# Patient Record
Sex: Female | Born: 1970 | Race: White | Hispanic: No | Marital: Married | Smoking: Never smoker
Health system: Southern US, Community
[De-identification: ages and names within clinical notes are randomized; demographics above are authoritative.]

## PROBLEM LIST (undated history)

## (undated) DIAGNOSIS — M51369 Other intervertebral disc degeneration, lumbar region without mention of lumbar back pain or lower extremity pain: Secondary | ICD-10-CM

## (undated) DIAGNOSIS — G4730 Sleep apnea, unspecified: Secondary | ICD-10-CM

## (undated) DIAGNOSIS — E559 Vitamin D deficiency, unspecified: Secondary | ICD-10-CM

## (undated) DIAGNOSIS — M5136 Other intervertebral disc degeneration, lumbar region: Secondary | ICD-10-CM

## (undated) DIAGNOSIS — R112 Nausea with vomiting, unspecified: Secondary | ICD-10-CM

## (undated) DIAGNOSIS — R200 Anesthesia of skin: Secondary | ICD-10-CM

## (undated) DIAGNOSIS — R748 Abnormal levels of other serum enzymes: Secondary | ICD-10-CM

## (undated) DIAGNOSIS — K760 Fatty (change of) liver, not elsewhere classified: Secondary | ICD-10-CM

## (undated) DIAGNOSIS — E039 Hypothyroidism, unspecified: Secondary | ICD-10-CM

## (undated) DIAGNOSIS — E8881 Metabolic syndrome: Secondary | ICD-10-CM

## (undated) DIAGNOSIS — Z9889 Other specified postprocedural states: Secondary | ICD-10-CM

## (undated) DIAGNOSIS — T4145XA Adverse effect of unspecified anesthetic, initial encounter: Secondary | ICD-10-CM

## (undated) DIAGNOSIS — T8859XA Other complications of anesthesia, initial encounter: Secondary | ICD-10-CM

## (undated) DIAGNOSIS — D649 Anemia, unspecified: Secondary | ICD-10-CM

## (undated) DIAGNOSIS — M1990 Unspecified osteoarthritis, unspecified site: Secondary | ICD-10-CM

## (undated) DIAGNOSIS — R51 Headache: Secondary | ICD-10-CM

## (undated) HISTORY — PX: DILATION AND CURETTAGE OF UTERUS: SHX78

## (undated) HISTORY — PX: CHOLECYSTECTOMY: SHX55

## (undated) HISTORY — PX: ABDOMINAL HYSTERECTOMY: SHX81

---

## 1999-05-12 ENCOUNTER — Other Ambulatory Visit: Admission: RE | Admit: 1999-05-12 | Discharge: 1999-05-12 | Payer: Self-pay | Admitting: Obstetrics and Gynecology

## 1999-12-30 ENCOUNTER — Ambulatory Visit (HOSPITAL_COMMUNITY): Admission: RE | Admit: 1999-12-30 | Discharge: 1999-12-30 | Payer: Self-pay | Admitting: Obstetrics and Gynecology

## 2001-01-12 ENCOUNTER — Other Ambulatory Visit: Admission: RE | Admit: 2001-01-12 | Discharge: 2001-01-12 | Payer: Self-pay | Admitting: Obstetrics and Gynecology

## 2002-01-31 ENCOUNTER — Observation Stay (HOSPITAL_COMMUNITY): Admission: RE | Admit: 2002-01-31 | Discharge: 2002-02-01 | Payer: Self-pay | Admitting: Obstetrics and Gynecology

## 2005-05-07 ENCOUNTER — Ambulatory Visit: Payer: Self-pay | Admitting: Internal Medicine

## 2011-01-25 ENCOUNTER — Ambulatory Visit: Payer: Self-pay | Admitting: Internal Medicine

## 2011-09-12 ENCOUNTER — Emergency Department: Payer: Self-pay | Admitting: Internal Medicine

## 2012-02-13 ENCOUNTER — Emergency Department: Payer: Self-pay | Admitting: Emergency Medicine

## 2012-03-02 ENCOUNTER — Ambulatory Visit: Payer: Self-pay | Admitting: Internal Medicine

## 2012-03-20 ENCOUNTER — Ambulatory Visit: Payer: Self-pay | Admitting: Internal Medicine

## 2013-02-08 IMAGING — MG MAM DGTL SCRN MAM NO ORDER W/CAD
1 series · 4 of 4 positions shown · non-contrast
Comparison: none

REASON FOR EXAM: scr mammo no order
COMMENTS:

PROCEDURE:     MAM - MAM DGTL SCRN MAM NO ORDER W/CAD  - March 20, 2012  [DATE]
RESULT:     Comparison is made to the prior exam of January 25, 2011. The
breast parenchyma bilaterally is predominantly fatty. No dominant mass or
malignant appearing microcalcifications are identified.

[Series 8416: R CC · right · 4 of 4 slices shown]
[im 1/4]
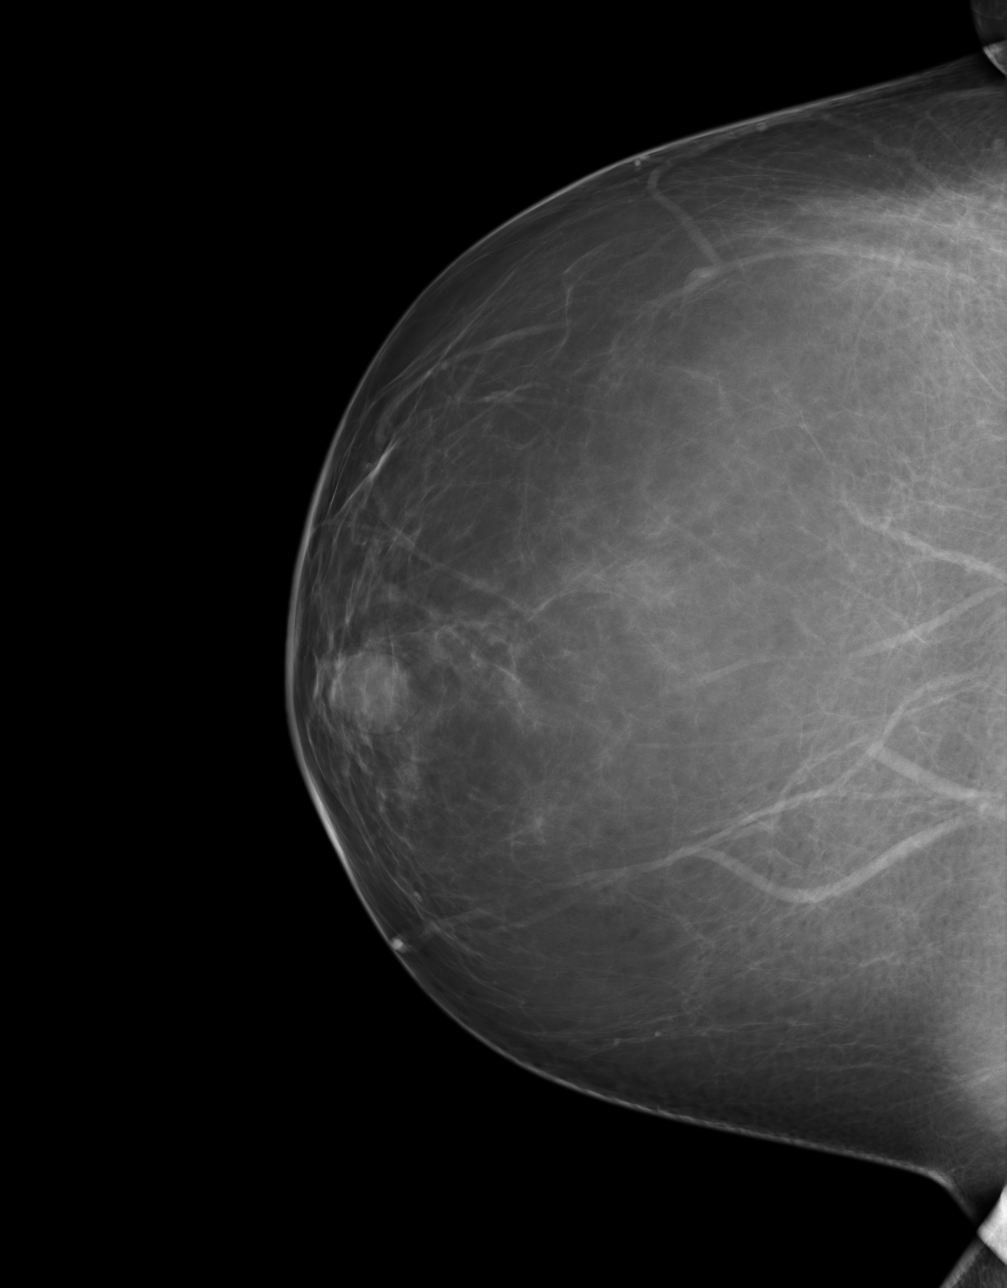
[im 2/4]
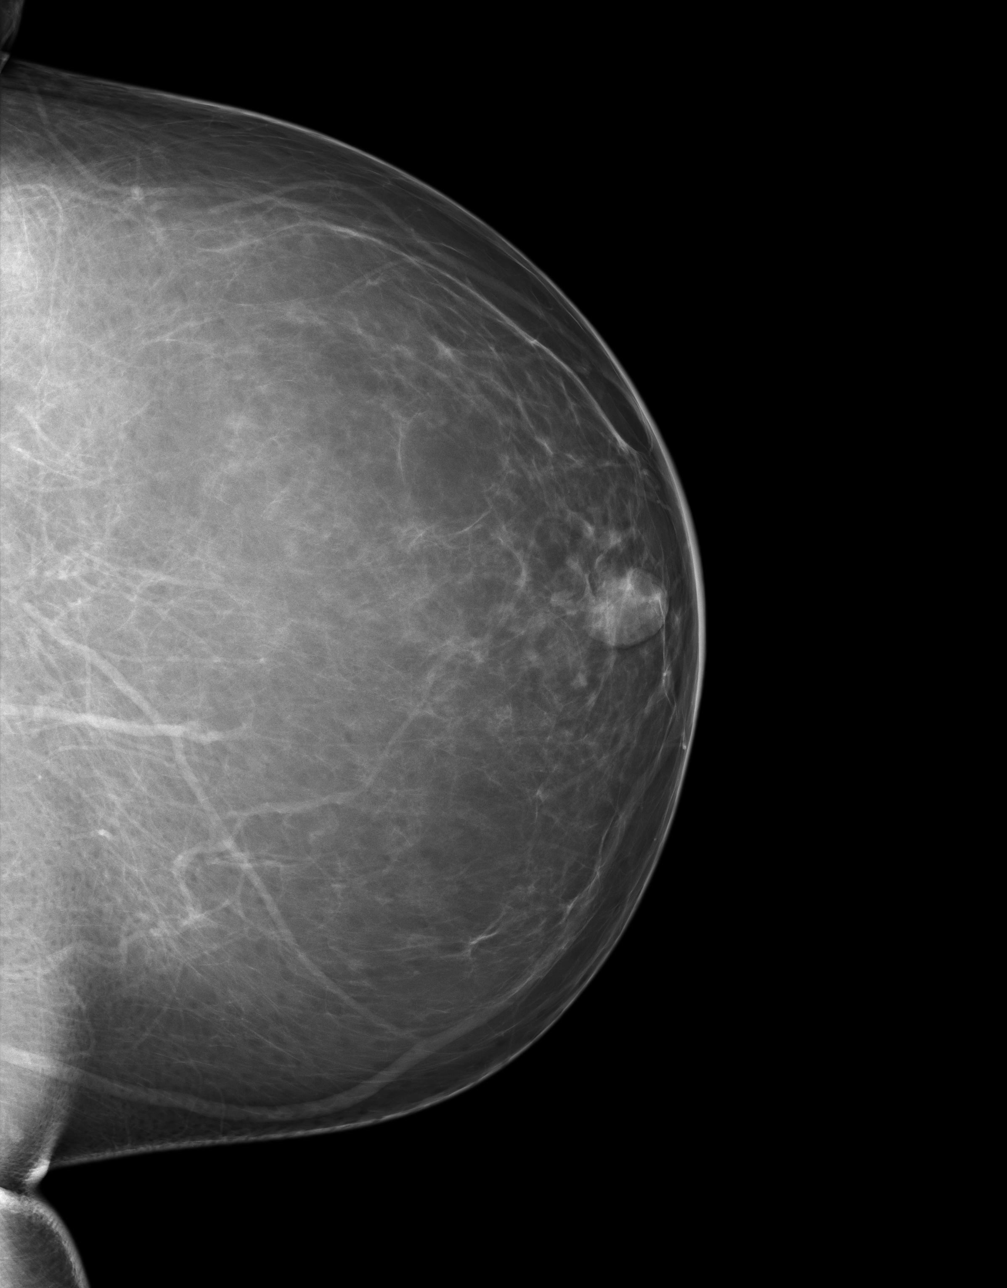
[im 3/4]
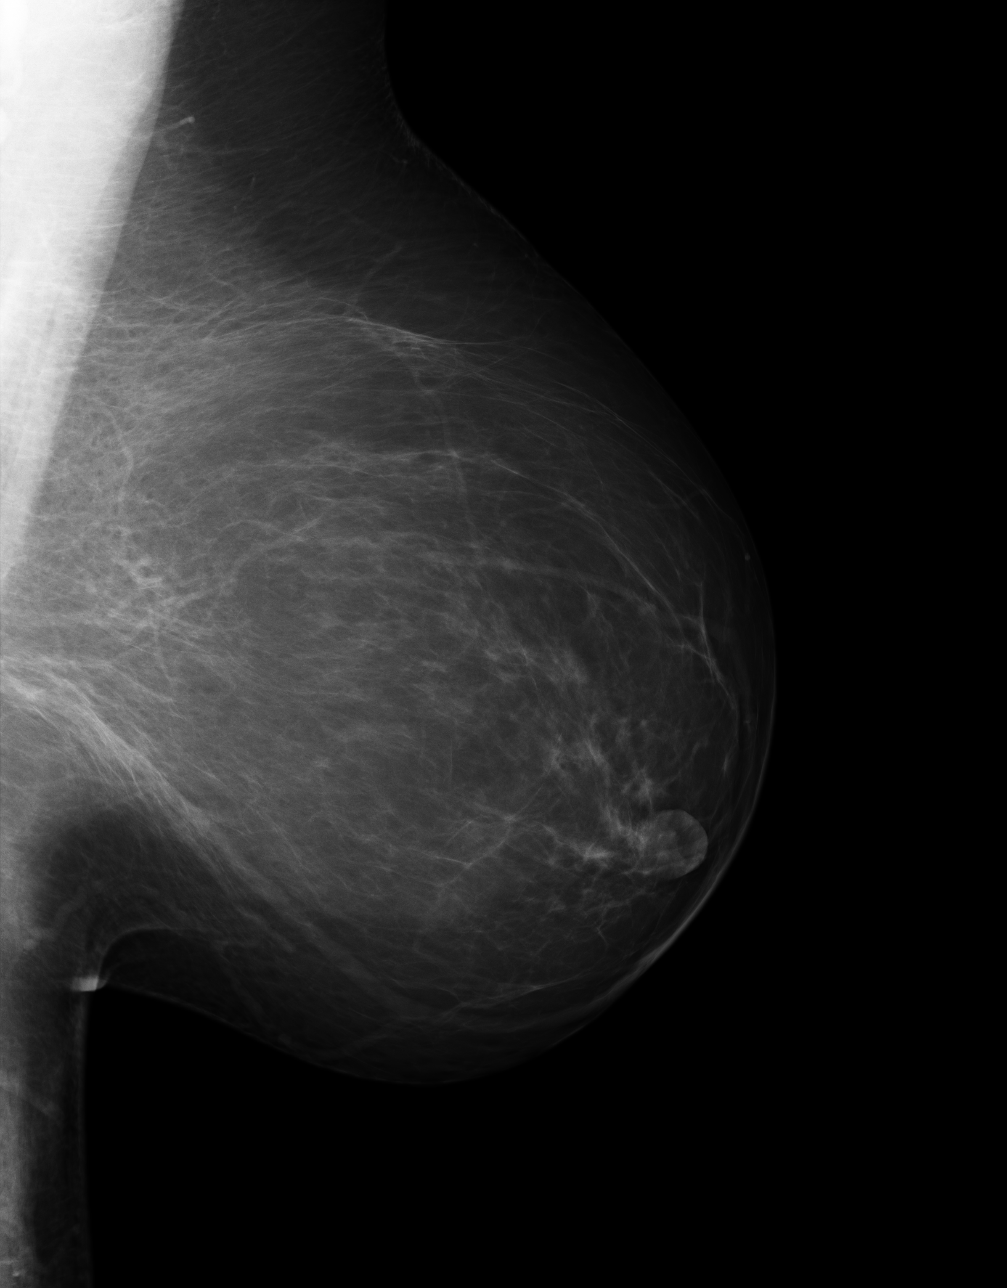
[im 4/4]
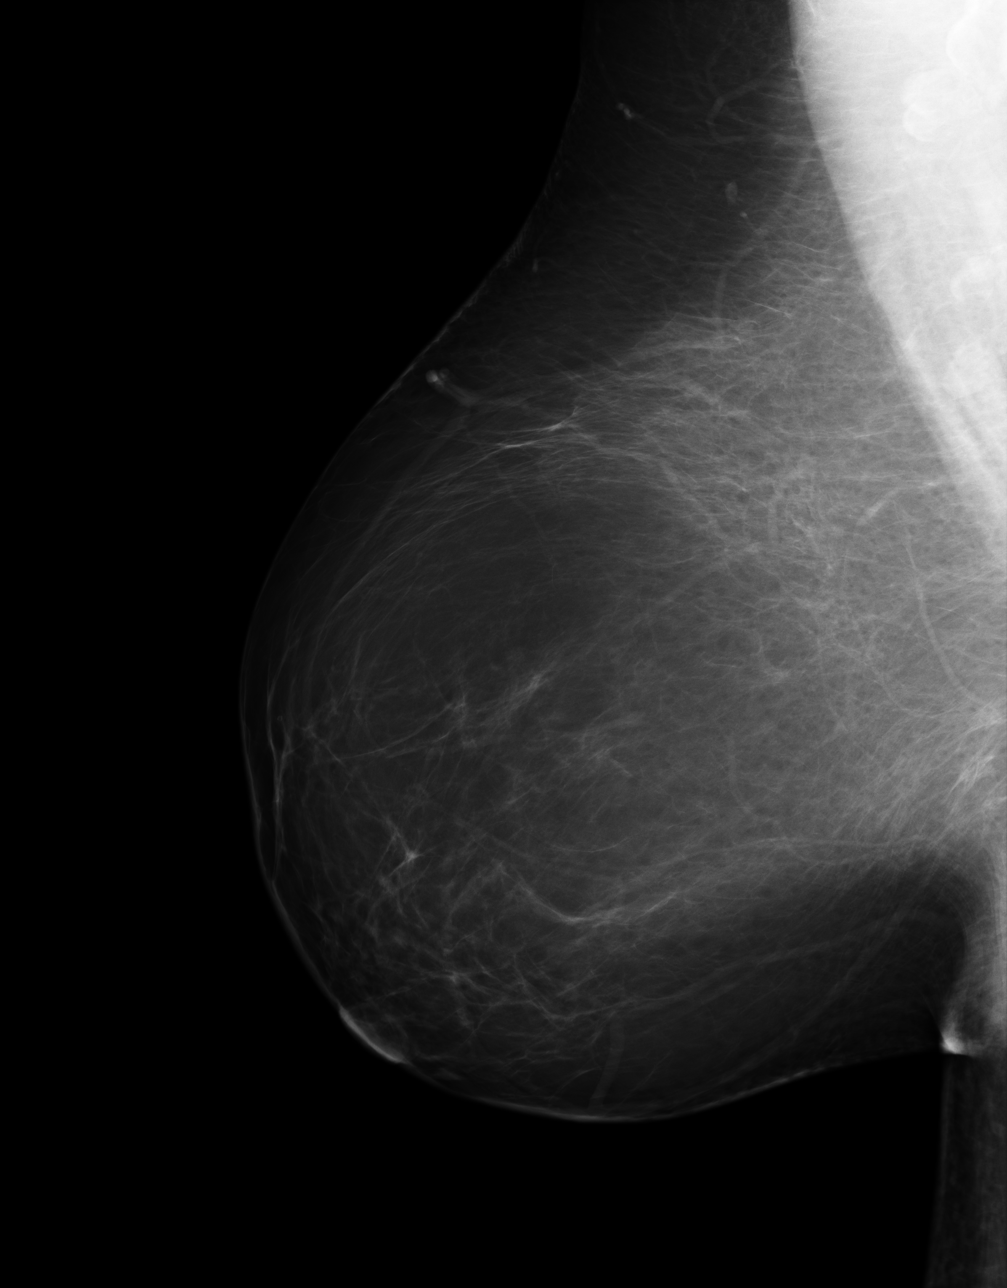

[4 of 4 positions shown; findings below may reference images not displayed]

IMPRESSION: 1. Stable, bilaterally benign-appearing screening mammography.
2. Continued annual screening mammography is recommended.

BI-RADS: Category 1 - Negative

A NEGATIVE MAMMOGRAM REPORT DOES NOT PRECLUDE BIOPSY OR OTHER EVALUATION OF
A CLINICALLY PALPABLE OR OTHERWISE SUSPICIOUS MASS OR LESION. BREAST CANCER
MAY NOT BE DETECTED BY MAMMOGRAPHY IN UP TO 10% OF CASES.

[REDACTED]

## 2013-05-28 ENCOUNTER — Ambulatory Visit: Payer: Self-pay | Admitting: Internal Medicine

## 2013-05-30 ENCOUNTER — Ambulatory Visit: Payer: Self-pay | Admitting: Internal Medicine

## 2013-11-15 ENCOUNTER — Other Ambulatory Visit: Payer: Self-pay | Admitting: Neurosurgery

## 2013-11-15 DIAGNOSIS — M5126 Other intervertebral disc displacement, lumbar region: Secondary | ICD-10-CM

## 2013-11-22 ENCOUNTER — Ambulatory Visit
Admission: RE | Admit: 2013-11-22 | Discharge: 2013-11-22 | Disposition: A | Discharge status: Home / Self Care | Payer: 59 | Source: Ambulatory Visit | Attending: Neurosurgery | Admitting: Neurosurgery

## 2013-11-22 DIAGNOSIS — M5126 Other intervertebral disc displacement, lumbar region: Secondary | ICD-10-CM

## 2013-11-30 ENCOUNTER — Other Ambulatory Visit: Payer: Self-pay | Admitting: Neurosurgery

## 2013-11-30 ENCOUNTER — Encounter (HOSPITAL_COMMUNITY): Payer: Self-pay

## 2013-11-30 ENCOUNTER — Encounter (HOSPITAL_COMMUNITY)
Admission: RE | Admit: 2013-11-30 | Discharge: 2013-11-30 | Disposition: A | Discharge status: Home / Self Care | Payer: 59 | Source: Ambulatory Visit | Attending: Neurosurgery | Admitting: Neurosurgery

## 2013-11-30 DIAGNOSIS — Z01812 Encounter for preprocedural laboratory examination: Secondary | ICD-10-CM | POA: Insufficient documentation

## 2013-11-30 HISTORY — DX: Unspecified osteoarthritis, unspecified site: M19.90

## 2013-11-30 HISTORY — DX: Other complications of anesthesia, initial encounter: T88.59XA

## 2013-11-30 HISTORY — DX: Adverse effect of unspecified anesthetic, initial encounter: T41.45XA

## 2013-11-30 HISTORY — DX: Nausea with vomiting, unspecified: R11.2

## 2013-11-30 HISTORY — DX: Other specified postprocedural states: Z98.890

## 2013-11-30 HISTORY — DX: Anesthesia of skin: R20.0

## 2013-11-30 HISTORY — DX: Hypothyroidism, unspecified: E03.9

## 2013-11-30 HISTORY — DX: Sleep apnea, unspecified: G47.30

## 2013-11-30 HISTORY — DX: Headache: R51

## 2013-11-30 HISTORY — DX: Anemia, unspecified: D64.9

## 2013-11-30 LAB — CBC
HCT: 39.5 % (ref 36.0–46.0)
Hemoglobin: 13.3 g/dL (ref 12.0–15.0)
MCH: 31 pg (ref 26.0–34.0)
MCHC: 33.7 g/dL (ref 30.0–36.0)
MCV: 92.1 fL (ref 78.0–100.0)
PLATELETS: 228 10*3/uL (ref 150–400)
RBC: 4.29 MIL/uL (ref 3.87–5.11)
RDW: 13.4 % (ref 11.5–15.5)
WBC: 8.6 10*3/uL (ref 4.0–10.5)

## 2013-11-30 LAB — BASIC METABOLIC PANEL
BUN: 13 mg/dL (ref 6–23)
CALCIUM: 9.6 mg/dL (ref 8.4–10.5)
CO2: 26 mEq/L (ref 19–32)
CREATININE: 0.69 mg/dL (ref 0.50–1.10)
Chloride: 100 mEq/L (ref 96–112)
GFR calc Af Amer: >90 mL/min (ref >90)
GLUCOSE: 92 mg/dL (ref 70–99)
Potassium: 4.6 mEq/L (ref 3.7–5.3)
Sodium: 137 mEq/L (ref 137–147)

## 2013-11-30 LAB — SURGICAL PCR SCREEN
MRSA, PCR: NEGATIVE
Staphylococcus aureus: NEGATIVE

## 2013-11-30 NOTE — Pre-Procedure Instructions (Addendum)
Jackie Reynolds  11/30/2013   Your procedure is scheduled on:  Wednesday, December 05, 2013 at 8:30 AM  Report to [facility] Short Stay (use Main Entrance "A'') at 6:30 AM.  Call this number if you have problems the morning of surgery: [phone]   Remember:   Do not eat food or drink liquids after midnight.   Take these medicines the morning of surgery with A SIP OF WATER: levothyroxine (SYNTHROID, LEVOTHROID, if needed: hydrocodone for pain,  zicam for congestion ,   Stop taking Aspirin and herbal medications. Do not take any NSAIDs ie: Ibuprofen, Advil, Naproxen or any medication containing Aspirin.  Do not wear jewelry, make-up or nail polish.  Do not wear lotions, powders, or perfumes. You may wear deodorant.  Do not shave 48 hours prior to surgery.   Do not bring valuables to the hospital.  [facility] is not responsible for any belongings or valuables.               Contacts, dentures or bridgework may not be worn into surgery.  Leave suitcase in the car. After surgery it may be brought to your room.  For patients admitted to the hospital, discharge time is determined by your treatment team.               Patients discharged the day of surgery will not be allowed to drive home.  Name and phone number of your driver:   Special Instructions:  Special Instructions:Special Instructions: [facility] - Preparing for Surgery  Before surgery, you can play an important role.  Because skin is not sterile, your skin needs to be as free of germs as possible.  You can reduce the number of germs on you skin by washing with CHG (chlorahexidine gluconate) soap before surgery.  CHG is an antiseptic cleaner which kills germs and bonds with the skin to continue killing germs even after washing.  Please DO NOT use if you have an allergy to CHG or antibacterial soaps.  If your skin becomes reddened/irritated stop using the CHG and inform your nurse when you arrive at Short Stay.  Do not shave  (including legs and underarms) for at least 48 hours prior to the first CHG shower.  You may shave your face.  Please follow these instructions carefully:   1.  Shower with CHG Soap the night before surgery and the morning of Surgery.  2.  If you choose to wash your hair, wash your hair first as usual with your normal shampoo.  3.  After you shampoo, rinse your hair and body thoroughly to remove the Shampoo.  4.  Use CHG as you would any other liquid soap.  You can apply chg directly  to the skin and wash gently with scrungie or a clean washcloth.  5.  Apply the CHG Soap to your body ONLY FROM THE NECK DOWN.  Do not use on open wounds or open sores.  Avoid contact with your eyes, ears, mouth and genitals (private parts).  Wash genitals (private parts) with your normal soap.  6.  Wash thoroughly, paying special attention to the area where your surgery will be performed.  7.  Thoroughly rinse your body with warm water from the neck down.  8.  DO NOT shower/wash with your normal soap after using and rinsing off the CHG Soap.  9.  Pat yourself dry with a clean towel.            10.  Wear  clean pajamas.            11.  Place clean sheets on your bed the night of your first shower and do not sleep with pets.  Day of Surgery  Do not apply any lotions the morning of surgery.  Please wear clean clothes to the hospital/surgery center.   Please read over the following fact sheets that you were given: Pain Booklet, Coughing and Deep Breathing, MRSA Information and Surgical Site Infection Prevention

## 2013-12-04 MED ORDER — CEFAZOLIN SODIUM 10 G IJ SOLR
3.0000 g | INTRAMUSCULAR | Status: AC
  Administered 2013-12-05: 3 g via INTRAVENOUS
  Filled 2013-12-04: qty 3000

## 2013-12-05 ENCOUNTER — Ambulatory Visit (HOSPITAL_COMMUNITY): Payer: 59 | Admitting: Anesthesiology

## 2013-12-05 ENCOUNTER — Encounter (HOSPITAL_COMMUNITY): Payer: 59 | Admitting: Anesthesiology

## 2013-12-05 ENCOUNTER — Encounter (HOSPITAL_COMMUNITY): Payer: Self-pay | Admitting: *Deleted

## 2013-12-05 ENCOUNTER — Encounter (HOSPITAL_COMMUNITY)
Admission: RE | Disposition: A | Discharge status: Home / Self Care | Payer: Self-pay | Source: Ambulatory Visit | Attending: Neurosurgery

## 2013-12-05 ENCOUNTER — Ambulatory Visit (HOSPITAL_COMMUNITY): Payer: 59

## 2013-12-05 ENCOUNTER — Ambulatory Visit (HOSPITAL_COMMUNITY)
Admission: RE | Admit: 2013-12-05 | Discharge: 2013-12-05 | Disposition: A | Discharge status: Home / Self Care | Payer: 59 | Source: Ambulatory Visit | Attending: Neurosurgery | Admitting: Neurosurgery

## 2013-12-05 DIAGNOSIS — M129 Arthropathy, unspecified: Secondary | ICD-10-CM | POA: Insufficient documentation

## 2013-12-05 DIAGNOSIS — M5126 Other intervertebral disc displacement, lumbar region: Secondary | ICD-10-CM | POA: Diagnosis present

## 2013-12-05 DIAGNOSIS — G4730 Sleep apnea, unspecified: Secondary | ICD-10-CM | POA: Insufficient documentation

## 2013-12-05 DIAGNOSIS — D649 Anemia, unspecified: Secondary | ICD-10-CM | POA: Insufficient documentation

## 2013-12-05 DIAGNOSIS — E039 Hypothyroidism, unspecified: Secondary | ICD-10-CM | POA: Insufficient documentation

## 2013-12-05 DIAGNOSIS — R51 Headache: Secondary | ICD-10-CM | POA: Insufficient documentation

## 2013-12-05 DIAGNOSIS — R209 Unspecified disturbances of skin sensation: Secondary | ICD-10-CM | POA: Insufficient documentation

## 2013-12-05 HISTORY — PX: LUMBAR LAMINECTOMY/DECOMPRESSION MICRODISCECTOMY: SHX5026

## 2013-12-05 SURGERY — LUMBAR LAMINECTOMY/DECOMPRESSION MICRODISCECTOMY 1 LEVEL
Anesthesia: General | Site: Back | Laterality: Right

## 2013-12-05 MED ORDER — DEXAMETHASONE SODIUM PHOSPHATE 4 MG/ML IJ SOLN
INTRAMUSCULAR | Status: AC
  Filled 2013-12-05: qty 2

## 2013-12-05 MED ORDER — PROMETHAZINE HCL 25 MG/ML IJ SOLN
INTRAMUSCULAR | Status: AC
  Filled 2013-12-05: qty 1

## 2013-12-05 MED ORDER — LACTATED RINGERS IV SOLN
INTRAVENOUS | Status: DC | PRN
  Administered 2013-12-05 (×2): via INTRAVENOUS

## 2013-12-05 MED ORDER — NEOSTIGMINE METHYLSULFATE 1 MG/ML IJ SOLN
INTRAMUSCULAR | Status: DC | PRN
  Administered 2013-12-05: 3 mg via INTRAVENOUS

## 2013-12-05 MED ORDER — MIDAZOLAM HCL 2 MG/2ML IJ SOLN
INTRAMUSCULAR | Status: AC
  Filled 2013-12-05: qty 2

## 2013-12-05 MED ORDER — OXYCODONE HCL 5 MG/5ML PO SOLN: 5.0000 mg | Freq: Once | ORAL | Status: DC | PRN

## 2013-12-05 MED ORDER — LIDOCAINE HCL (CARDIAC) 20 MG/ML IV SOLN
INTRAVENOUS | Status: DC | PRN
  Administered 2013-12-05: 100 mg via INTRAVENOUS

## 2013-12-05 MED ORDER — LACTATED RINGERS IV SOLN: INTRAVENOUS | Status: DC

## 2013-12-05 MED ORDER — HEMOSTATIC AGENTS (NO CHARGE) OPTIME
TOPICAL | Status: DC | PRN
  Administered 2013-12-05: 1 via TOPICAL

## 2013-12-05 MED ORDER — NEOSTIGMINE METHYLSULFATE 1 MG/ML IJ SOLN
INTRAMUSCULAR | Status: AC
  Filled 2013-12-05: qty 10

## 2013-12-05 MED ORDER — MENTHOL 3 MG MT LOZG: 1.0000 | LOZENGE | OROMUCOSAL | Status: DC | PRN

## 2013-12-05 MED ORDER — GLYCOPYRROLATE 0.2 MG/ML IJ SOLN
INTRAMUSCULAR | Status: DC | PRN
  Administered 2013-12-05: 0.4 mg via INTRAVENOUS

## 2013-12-05 MED ORDER — DEXAMETHASONE SODIUM PHOSPHATE 4 MG/ML IJ SOLN
INTRAMUSCULAR | Status: DC | PRN
Start: 2013-12-05 — End: 2013-12-05
  Administered 2013-12-05: 8 mg via INTRAVENOUS

## 2013-12-05 MED ORDER — LEVOTHYROXINE SODIUM 175 MCG PO TABS
175.0000 ug | ORAL_TABLET | Freq: Every day | ORAL | Status: DC
  Filled 2013-12-05: qty 1

## 2013-12-05 MED ORDER — METOCLOPRAMIDE HCL 5 MG/ML IJ SOLN: 10.0000 mg | Freq: Once | INTRAMUSCULAR | Status: DC | PRN

## 2013-12-05 MED ORDER — DIAZEPAM 5 MG PO TABS: 5.0000 mg | ORAL_TABLET | Freq: Four times a day (QID) | ORAL | Status: DC | PRN

## 2013-12-05 MED ORDER — GLYCOPYRROLATE 0.2 MG/ML IJ SOLN
INTRAMUSCULAR | Status: AC
  Filled 2013-12-05: qty 2

## 2013-12-05 MED ORDER — ROCURONIUM BROMIDE 100 MG/10ML IV SOLN
INTRAVENOUS | Status: DC | PRN
  Administered 2013-12-05: 50 mg via INTRAVENOUS

## 2013-12-05 MED ORDER — HYDROCODONE-ACETAMINOPHEN 5-325 MG PO TABS
1.0000 | ORAL_TABLET | ORAL | Status: DC | PRN
Start: 2013-12-05 — End: 2013-12-05

## 2013-12-05 MED ORDER — DEXTROSE 5 % IV SOLN
3.0000 g | Freq: Three times a day (TID) | INTRAVENOUS | Status: DC
  Filled 2013-12-05 (×2): qty 3000

## 2013-12-05 MED ORDER — ONDANSETRON HCL 4 MG/2ML IJ SOLN
4.0000 mg | INTRAMUSCULAR | Status: DC | PRN
Start: 2013-12-05 — End: 2013-12-05

## 2013-12-05 MED ORDER — MORPHINE SULFATE 2 MG/ML IJ SOLN: 1.0000 mg | INTRAMUSCULAR | Status: DC | PRN

## 2013-12-05 MED ORDER — MIDAZOLAM HCL 5 MG/5ML IJ SOLN
INTRAMUSCULAR | Status: DC | PRN
  Administered 2013-12-05: 2 mg via INTRAVENOUS

## 2013-12-05 MED ORDER — ACETAMINOPHEN 650 MG RE SUPP: 650.0000 mg | RECTAL | Status: DC | PRN

## 2013-12-05 MED ORDER — OXYCODONE-ACETAMINOPHEN 10-325 MG PO TABS: 1.0000 | ORAL_TABLET | ORAL | Status: DC | PRN

## 2013-12-05 MED ORDER — SCOPOLAMINE 1 MG/3DAYS TD PT72
MEDICATED_PATCH | TRANSDERMAL | Status: DC | PRN
  Administered 2013-12-05: 1 via TRANSDERMAL

## 2013-12-05 MED ORDER — OXYCODONE HCL 5 MG PO TABS: 5.0000 mg | ORAL_TABLET | Freq: Once | ORAL | Status: DC | PRN

## 2013-12-05 MED ORDER — SCOPOLAMINE 1 MG/3DAYS TD PT72
MEDICATED_PATCH | TRANSDERMAL | Status: AC
  Filled 2013-12-05: qty 1

## 2013-12-05 MED ORDER — DOCUSATE SODIUM 100 MG PO CAPS
100.0000 mg | ORAL_CAPSULE | Freq: Two times a day (BID) | ORAL | Status: DC
  Administered 2013-12-05: 100 mg via ORAL
  Filled 2013-12-05 (×2): qty 1

## 2013-12-05 MED ORDER — PROPOFOL 10 MG/ML IV BOLUS
INTRAVENOUS | Status: AC
  Filled 2013-12-05: qty 20

## 2013-12-05 MED ORDER — ACETAMINOPHEN 325 MG PO TABS: 650.0000 mg | ORAL_TABLET | ORAL | Status: DC | PRN

## 2013-12-05 MED ORDER — PHENOL 1.4 % MT LIQD: 1.0000 | OROMUCOSAL | Status: DC | PRN

## 2013-12-05 MED ORDER — GLYCOPYRROLATE 0.2 MG/ML IJ SOLN
INTRAMUSCULAR | Status: AC
  Filled 2013-12-05: qty 1

## 2013-12-05 MED ORDER — ARTIFICIAL TEARS OP OINT
TOPICAL_OINTMENT | OPHTHALMIC | Status: DC | PRN
  Administered 2013-12-05: 1 via OPHTHALMIC

## 2013-12-05 MED ORDER — BACITRACIN ZINC 500 UNIT/GM EX OINT
TOPICAL_OINTMENT | CUTANEOUS | Status: DC | PRN
  Administered 2013-12-05: 1 via TOPICAL

## 2013-12-05 MED ORDER — ONDANSETRON HCL 4 MG/2ML IJ SOLN
INTRAMUSCULAR | Status: DC | PRN
  Administered 2013-12-05: 4 mg via INTRAVENOUS

## 2013-12-05 MED ORDER — HYDROMORPHONE HCL PF 1 MG/ML IJ SOLN: 0.2500 mg | INTRAMUSCULAR | Status: DC | PRN

## 2013-12-05 MED ORDER — PROMETHAZINE HCL 25 MG/ML IJ SOLN
6.2500 mg | Freq: Once | INTRAMUSCULAR | Status: AC
  Administered 2013-12-05: 6.25 mg via INTRAVENOUS

## 2013-12-05 MED ORDER — LIDOCAINE HCL (CARDIAC) 20 MG/ML IV SOLN
INTRAVENOUS | Status: AC
  Filled 2013-12-05: qty 5

## 2013-12-05 MED ORDER — 0.9 % SODIUM CHLORIDE (POUR BTL) OPTIME
TOPICAL | Status: DC | PRN
  Administered 2013-12-05: 1000 mL

## 2013-12-05 MED ORDER — FENTANYL CITRATE 0.05 MG/ML IJ SOLN
INTRAMUSCULAR | Status: AC
  Filled 2013-12-05: qty 5

## 2013-12-05 MED ORDER — OXYCODONE-ACETAMINOPHEN 5-325 MG PO TABS: 1.0000 | ORAL_TABLET | ORAL | Status: DC | PRN

## 2013-12-05 MED ORDER — PROPOFOL 10 MG/ML IV BOLUS
INTRAVENOUS | Status: DC | PRN
  Administered 2013-12-05: 200 mg via INTRAVENOUS

## 2013-12-05 MED ORDER — BUPIVACAINE-EPINEPHRINE PF 0.5-1:200000 % IJ SOLN
INTRAMUSCULAR | Status: DC | PRN
  Administered 2013-12-05: 10 mL

## 2013-12-05 MED ORDER — FENTANYL CITRATE 0.05 MG/ML IJ SOLN
INTRAMUSCULAR | Status: DC | PRN
  Administered 2013-12-05: 150 ug via INTRAVENOUS
  Administered 2013-12-05: 100 ug via INTRAVENOUS

## 2013-12-05 MED ORDER — DSS 100 MG PO CAPS: 100.0000 mg | ORAL_CAPSULE | Freq: Two times a day (BID) | ORAL | Status: DC

## 2013-12-05 MED ORDER — ALUM & MAG HYDROXIDE-SIMETH 200-200-20 MG/5ML PO SUSP: 30.0000 mL | Freq: Four times a day (QID) | ORAL | Status: DC | PRN

## 2013-12-05 MED ORDER — THROMBIN 5000 UNITS EX SOLR
CUTANEOUS | Status: DC | PRN
  Administered 2013-12-05 (×2): 5000 [IU] via TOPICAL

## 2013-12-05 MED ORDER — ROCURONIUM BROMIDE 50 MG/5ML IV SOLN
INTRAVENOUS | Status: AC
  Filled 2013-12-05: qty 1

## 2013-12-05 MED ORDER — SODIUM CHLORIDE 0.9 % IR SOLN
Status: DC | PRN
  Administered 2013-12-05: 09:00:00

## 2013-12-05 MED ORDER — ONDANSETRON HCL 4 MG/2ML IJ SOLN
INTRAMUSCULAR | Status: AC
  Filled 2013-12-05: qty 2

## 2013-12-05 SURGICAL SUPPLY — 59 items
BAG DECANTER FOR FLEXI CONT (MISCELLANEOUS) ×3 IMPLANT
BENZOIN TINCTURE PRP APPL 2/3 (GAUZE/BANDAGES/DRESSINGS) ×3 IMPLANT
BLADE SURG ROTATE 9660 (MISCELLANEOUS) IMPLANT
BRUSH SCRUB EZ PLAIN DRY (MISCELLANEOUS) ×3 IMPLANT
BUR ACORN 6.0 (BURR) ×2 IMPLANT
BUR ACORN 6.0MM (BURR) ×1
BUR MATCHSTICK NEURO 3.0 LAGG (BURR) ×3 IMPLANT
CANISTER SUCT 3000ML (MISCELLANEOUS) ×3 IMPLANT
CLOSURE WOUND 1/2 X4 (GAUZE/BANDAGES/DRESSINGS) ×1
CONT SPEC 4OZ CLIKSEAL STRL BL (MISCELLANEOUS) ×3 IMPLANT
DRAPE LAPAROTOMY 100X72X124 (DRAPES) ×3 IMPLANT
DRAPE MICROSCOPE LEICA (MISCELLANEOUS) ×3 IMPLANT
DRAPE POUCH INSTRU U-SHP 10X18 (DRAPES) ×3 IMPLANT
DRAPE SURG 17X23 STRL (DRAPES) ×12 IMPLANT
ELECT BLADE 4.0 EZ CLEAN MEGAD (MISCELLANEOUS) ×3
ELECT BLADE 6.5 EXT (BLADE) ×3 IMPLANT
ELECT REM PT RETURN 9FT ADLT (ELECTROSURGICAL) ×3
ELECTRODE BLDE 4.0 EZ CLN MEGD (MISCELLANEOUS) ×1 IMPLANT
ELECTRODE REM PT RTRN 9FT ADLT (ELECTROSURGICAL) ×1 IMPLANT
GAUZE SPONGE 4X4 16PLY XRAY LF (GAUZE/BANDAGES/DRESSINGS) IMPLANT
GLOVE BIO SURGEON STRL SZ8 (GLOVE) ×3 IMPLANT
GLOVE BIO SURGEON STRL SZ8.5 (GLOVE) ×3 IMPLANT
GLOVE BIOGEL PI IND STRL 7.5 (GLOVE) ×2 IMPLANT
GLOVE BIOGEL PI INDICATOR 7.5 (GLOVE) ×4
GLOVE EXAM NITRILE LRG STRL (GLOVE) IMPLANT
GLOVE EXAM NITRILE MD LF STRL (GLOVE) IMPLANT
GLOVE EXAM NITRILE XL STR (GLOVE) IMPLANT
GLOVE EXAM NITRILE XS STR PU (GLOVE) IMPLANT
GLOVE SS BIOGEL STRL SZ 8 (GLOVE) ×1 IMPLANT
GLOVE SUPERSENSE BIOGEL SZ 8 (GLOVE) ×2
GLOVE SURG SS PI 7.0 STRL IVOR (GLOVE) ×6 IMPLANT
GOWN BRE IMP SLV AUR LG STRL (GOWN DISPOSABLE) IMPLANT
GOWN BRE IMP SLV AUR XL STRL (GOWN DISPOSABLE) IMPLANT
GOWN STRL REIN 2XL LVL4 (GOWN DISPOSABLE) IMPLANT
GOWN STRL REUS W/ TWL LRG LVL3 (GOWN DISPOSABLE) ×1 IMPLANT
GOWN STRL REUS W/ TWL XL LVL3 (GOWN DISPOSABLE) ×2 IMPLANT
GOWN STRL REUS W/TWL LRG LVL3 (GOWN DISPOSABLE) ×2
GOWN STRL REUS W/TWL XL LVL3 (GOWN DISPOSABLE) ×4
KIT BASIN OR (CUSTOM PROCEDURE TRAY) ×3 IMPLANT
KIT ROOM TURNOVER OR (KITS) ×3 IMPLANT
NEEDLE HYPO 21X1.5 SAFETY (NEEDLE) IMPLANT
NEEDLE HYPO 22GX1.5 SAFETY (NEEDLE) ×3 IMPLANT
NS IRRIG 1000ML POUR BTL (IV SOLUTION) ×3 IMPLANT
PACK LAMINECTOMY NEURO (CUSTOM PROCEDURE TRAY) ×3 IMPLANT
PAD ARMBOARD 7.5X6 YLW CONV (MISCELLANEOUS) ×12 IMPLANT
PATTIES SURGICAL .5 X1 (DISPOSABLE) IMPLANT
RUBBERBAND STERILE (MISCELLANEOUS) ×6 IMPLANT
SPONGE GAUZE 4X4 12PLY (GAUZE/BANDAGES/DRESSINGS) ×3 IMPLANT
SPONGE SURGIFOAM ABS GEL SZ50 (HEMOSTASIS) ×3 IMPLANT
STRIP CLOSURE SKIN 1/2X4 (GAUZE/BANDAGES/DRESSINGS) ×2 IMPLANT
SUT VIC AB 1 CT1 18XBRD ANBCTR (SUTURE) ×1 IMPLANT
SUT VIC AB 1 CT1 8-18 (SUTURE) ×2
SUT VIC AB 2-0 CP2 18 (SUTURE) ×3 IMPLANT
SYR 20CC LL (SYRINGE) IMPLANT
SYR 20ML ECCENTRIC (SYRINGE) ×3 IMPLANT
TAPE CLOTH SURG 4X10 WHT LF (GAUZE/BANDAGES/DRESSINGS) ×3 IMPLANT
TOWEL OR 17X24 6PK STRL BLUE (TOWEL DISPOSABLE) ×3 IMPLANT
TOWEL OR 17X26 10 PK STRL BLUE (TOWEL DISPOSABLE) ×3 IMPLANT
WATER STERILE IRR 1000ML POUR (IV SOLUTION) ×3 IMPLANT

## 2013-12-05 NOTE — H&P (Signed)
Subjective: This is a 43 year old white female who's had back, right buttock and leg pain consistent with S1 radiculopathy. She has failed medical management and was worked up with a lumbar MRI. This demonstrated a herniated disc at L5-S1. I discussed the various treatment options including surgery. The patient has weighed the risks, benefits, and alternatives surgery and decided proceed with a right L5-S1 discectomy.   Past Medical History  Diagnosis Date  . Complication of anesthesia   . PONV (postoperative nausea and vomiting)   . Hypothyroidism   . Sleep apnea     Non- compliant  . Headache(784.0)   . Numbness     Hx: of right leg  . Arthritis   . Anemia     Hx: of     Past Surgical History  Procedure Laterality Date  . Abdominal hysterectomy    . Cholecystectomy    . Dilation and curettage of uterus      No Known Allergies  History  Substance Use Topics  . Smoking status: Never Smoker   . Smokeless tobacco: Never Used  . Alcohol Use: No    Family History  Problem Relation Age of Onset  . Cancer - Cervical Mother   . Hypertension Mother   . Diabetes Father   . Arthritis/Rheumatoid Father    Prior to Admission medications   Medication Sig Start Date End Date Taking? Authorizing Provider  Cholecalciferol ([employer] VIT D 5000 HIGH-POTENCY PO) Take 5,000 Units by mouth daily.   Yes Historical Provider, MD  HYDROcodone-acetaminophen (NORCO) 10-325 MG per tablet Take 1 tablet by mouth every 6 (six) hours as needed for moderate pain.   Yes Historical Provider, MD  ibuprofen (ADVIL,MOTRIN) 200 MG tablet Take 600 mg by mouth every 6 (six) hours as needed for moderate pain.   Yes Historical Provider, MD  levothyroxine (SYNTHROID, LEVOTHROID) 150 MCG tablet Take 150 mcg by mouth daily before breakfast.   Yes Historical Provider, MD  levothyroxine (SYNTHROID, LEVOTHROID) 175 MCG tablet Take 175 mcg by mouth daily before breakfast.   Yes Historical Provider, MD  OVER THE COUNTER  MEDICATION Place 1 spray into both nostrils daily as needed (congestion). Generic form on zicam   Yes Historical Provider, MD     Review of Systems  Positive ROS:  As above  All other systems have been reviewed and were otherwise negative with the exception of those mentioned in the HPI and as above.  Objective: Vital signs in last 24 hours: Temp:  [98.8 F (37.1 C)] 98.8 F (37.1 C) (03/04 0622) Pulse Rate:  [76] 76 (03/04 0622) Resp:  [20] 20 (03/04 0622) BP: (142)/(92) 142/92 mmHg (03/04 0622) SpO2:  [98 %] 98 % (03/04 0622)  General Appearance: Alert, cooperative, no distress, Head: Normocephalic, without obvious abnormality, atraumatic Eyes: PERRL, conjunctiva/corneas clear, EOM's intact,    Ears: Normal  Throat: Normal  Neck: Supple, symmetrical, trachea midline, no adenopathy; thyroid: No enlargement/tenderness/nodules; no carotid bruit or JVD Back: Symmetric, no curvature, ROM normal, no CVA tenderness Lungs: Clear to auscultation bilaterally, respirations unlabored Heart: Regular rate and rhythm, no murmur, rub or gallop Abdomen: Soft, non-tender,, no masses, no organomegaly Extremities: Extremities normal, atraumatic, no cyanosis or edema Pulses: 2+ and symmetric all extremities Skin: Skin color, texture, turgor normal, no rashes or lesions  NEUROLOGIC:   Mental status: alert and oriented, no aphasia, good attention span, Fund of knowledge/ memory ok Motor Exam - grossly normal Sensory Exam - grossly normal Reflexes:  Coordination - grossly normal Gait -  grossly normal Balance - grossly normal Cranial Nerves: I: smell Not tested  II: visual acuity  OS: Normal  OD: Normal   II: visual fields Full to confrontation  II: pupils Equal, round, reactive to light  III,VII: ptosis None  III,IV,VI: extraocular muscles  Full ROM  V: mastication Normal  V: facial light touch sensation  Normal  V,VII: corneal reflex  Present  VII: facial muscle function - upper   Normal  VII: facial muscle function - lower Normal  VIII: hearing Not tested  IX: soft palate elevation  Normal  IX,X: gag reflex Present  XI: trapezius strength  5/5  XI: sternocleidomastoid strength 5/5  XI: neck flexion strength  5/5  XII: tongue strength  Normal    Data Review Lab Results  Component Value Date   WBC 8.6 11/30/2013   HGB 13.3 11/30/2013   HCT 39.5 11/30/2013   MCV 92.1 11/30/2013   PLT 228 11/30/2013   Lab Results  Component Value Date   NA 137 11/30/2013   K 4.6 11/30/2013   CL 100 11/30/2013   CO2 26 11/30/2013   BUN 13 11/30/2013   CREATININE 0.69 11/30/2013   GLUCOSE 92 11/30/2013   No results found for this basename: INR, PROTIME    Assessment/Plan: Right L5-S1 ranges, lumbago, lumbar radiculopathy: I have discussed situation with the patient. I have reviewed her MR scan with her and pointed out the abnormalities. We have discussed the various treatment options including surgery. She has weighed the risks, benefits, and alternatives surgery and decided proceed with a right L5-S1 discectomy.   Pearley Baranek D 12/05/2013 8:25 AM

## 2013-12-05 NOTE — Transfer of Care (Signed)
Immediate Anesthesia Transfer of Care Note  Patient: Jackie Reynolds  Procedure(s) Performed: Procedure(s) with comments: Right Lumbar five Sacral one microdiskectomy (Right) - Right Lumbar five Sacral one microdiskectomy  Patient Location: PACU  Anesthesia Type:General  Level of Consciousness: awake, alert  and oriented  Airway & Oxygen Therapy: Patient Spontanous Breathing and Patient connected to nasal cannula oxygen  Post-op Assessment: Report given to PACU RN and Post -op Vital signs reviewed and stable  Post vital signs: Reviewed and stable  Complications: No apparent anesthesia complications

## 2013-12-05 NOTE — Anesthesia Postprocedure Evaluation (Signed)
Anesthesia Post Note  Patient: Jackie PughGwendolyn M Thom  Procedure(s) Performed: Procedure(s) (LRB): Right Lumbar five Sacral one microdiskectomy (Right)  Anesthesia type: General  Patient location: PACU  Post pain: Pain level controlled  Post assessment: Patient's Cardiovascular Status Stable  Last Vitals:  Filed Vitals:   12/05/13 1130  BP: 138/80  Pulse: 62  Temp: 36.6 C  Resp: 20    Post vital signs: Reviewed and stable  Level of consciousness: alert  Complications: No apparent anesthesia complications

## 2013-12-05 NOTE — Progress Notes (Signed)
Patient ID: Jackie Reynolds, female   DOB: 08-05-1971, 43 y.o.   MRN: [id number] Subjective:  The patient is alert and pleasant. She is in no apparent distress.  Objective: Vital signs in last 24 hours: Temp:  [98.3 F (36.8 C)-98.8 F (37.1 C)] 98.3 F (36.8 C) (03/04 1104) Pulse Rate:  [61-76] 72 (03/04 1104) Resp:  [15-22] 22 (03/04 1104) BP: (105-145)/(47-107) 128/47 mmHg (03/04 1100) SpO2:  [85 %-98 %] 92 % (03/04 1104)  Intake/Output from previous day:   Intake/Output this shift: Total I/O In: 1100 [I.V.:1100] Out: 200 [Blood:200]  Physical exam the patient is moving her lower extremities well.  Lab Results: No results found for this basename: WBC, HGB, HCT, PLT,  in the last 72 hours BMET No results found for this basename: NA, K, CL, CO2, GLUCOSE, BUN, CREATININE, CALCIUM,  in the last 72 hours  Studies/Results: Dg Lumbar Spine 1 View  12/05/2013   CLINICAL DATA:  L5-S1 microdiscectomy.  EXAM: LUMBAR SPINE - 1 VIEW  COMPARISON:  Lumbar spine MRI 11/22/2013.  FINDINGS: Single cross-table lateral view of the lumbar spine is limited by underpenetration of the image. There are soft tissue retractors in place posterior to L5, with a surgical probe in place with tip posterior to the L5-S1 interspace.  IMPRESSION: 1. Intraoperative localization, as above.   Electronically Signed   By: Trudie Reedaniel  Entrikin M.D.   On: 12/05/2013 09:58    Assessment/Plan: The patient is doing well.  LOS: 0 days     Marillyn Goren D 12/05/2013, 11:13 AM

## 2013-12-05 NOTE — Discharge Instructions (Signed)

## 2013-12-05 NOTE — Op Note (Signed)
Brief history: The patient is a 43 year old white female who has complained of back and right buttock/leg pain consistent with a right S1 radiculopathy. She has failed medical management and was worked up with a lumbar MRI. This demonstrated a herniated disc at L5-S1 on the right. I discussed the various treatment options with patient including surgery. She has weighed the risks, benefits, and alternatives surgery and decided proceed with a right L5-S1 discectomy.  Preoperative diagnosis: Right L5-S1 herniated disc, lumbago, lumbar radiculopathy  Postoperative diagnosis: The same  Procedure: Right L5-S1 Intervertebral discectomy using micro-dissection  Surgeon: Dr. Delma OfficerJeff Kimiah Hibner  Asst.: Dr. Marikay Alaravid Jones  Anesthesia: Gen. endotracheal  Estimated blood loss: Minimal  Drains: None  Complications: None  Description of procedure: The patient was brought to the operating room by the anesthesia team. General endotracheal anesthesia was induced. The patient was turned to the prone position on the Wilson frame. The patient's lumbosacral region was then prepared with Betadine scrub and Betadine solution. Sterile drapes were applied.  I then injected the area to be incised with Marcaine with epinephrine solution. I then used a scalpel to make a linear midline incision over the L5-S1 intervertebral disc space. I then used electrocautery to perform a right sided subperiosteal dissection exposing the spinous process and lamina of L5 and S1. We obtained intraoperative radiograph to confirm our location. I then inserted the [facility] ret[facility] for exposure.  We then brought the operative microscope into the field. Under its magnification and illumination we completed the microdissection. I used a high-speed drill to perform a laminotomy at L5 on the right. I then used a Kerrison punches to widen the laminotomy and removed the ligamentum flavum at L5-S1 on the right. We then used microdissection to free up the  thecal sac and the right S1 nerve root from the epidural tissue. I then used a Kerrison punch to perform a foraminotomy at about the right S1 nerve root. We then using the nerve root retractor to gently retract the thecal sac and the right S1 nerve root medially. This exposed the intervertebral disc. We identified the ruptured disc and remove it with the pituitary forceps. We did not enter into the intervertebral disc space.  I then palpated along the ventral surface of the thecal sac and along exit route of the right S1 nerve root and noted that the neural structures were well decompressed. This completed the decompression.  We then obtained hemostasis using bipolar electrocautery. We irrigated the wound out with bacitracin solution. We then removed the retractor. We then reapproximated the patient's thoracolumbar fascia with interrupted #1 Vicryl suture. We then reapproximated the patient's subcutaneous tissue with interrupted 2-0 Vicryl suture. We then reapproximated patient's skin with Steri-Strips and benzoin. The was then coated with bacitracin ointment. The drapes were removed. The patient was subsequently returned to the supine position where they were extubated by the anesthesia team. The patient was then transported to the postanesthesia care unit in stable condition. All sponge instrument and needle counts were reportedly correct at the end of this case.

## 2013-12-05 NOTE — Progress Notes (Signed)
Pt doing well. Pt and husband given D/C instructions with Rx's, verbal understanding of teaching was given. Pt D/C'd home via wheelchair @ 1630 per MD order. Pt is stable @ D/C and has no other needs. Rema FendtAshley Tyeson Tanimoto, RN

## 2013-12-05 NOTE — Evaluation (Signed)
Physical Therapy Evaluation Patient Details Name: Jackie Reynolds MRN: [id number] DOB: March 25, 1971 Today's Date: 12/05/2013 Time: 2330-0762 PT Time Calculation (min): 20 min  PT Assessment / Plan / Recommendation History of Present Illness  Pt presents with lumbar herniation L5-S1. Underwent laminectomy/ microdiscectomy.  Clinical Impression  Pt mobilizing at mod I level, safe back positioning discussed as well as return to activity. Recommend OP PT when tolerated for core strengthening and exercise progression. No further acute needs at this point. Pt to d/c later today.    PT Assessment  All further PT needs can be met in the next venue of care    Follow Up Recommendations  Outpatient PT    Does the patient have the potential to tolerate intense rehabilitation      Barriers to Discharge        Equipment Recommendations  None recommended by PT    Recommendations for Other Services     Frequency      Precautions / Restrictions Precautions Precautions: Back Precaution Comments: reviewed safe back posture and movements Restrictions Weight Bearing Restrictions: No   Pertinent Vitals/Pain VSS      Mobility  Bed Mobility Overal bed mobility: Modified Independent General bed mobility comments: vc's for bed mobility without twisting Transfers Overall transfer level: Modified independent General transfer comment: safe transfers with no physical assist needed Ambulation/Gait Ambulation/Gait assistance: Modified independent (Device/Increase time) Ambulation Distance (Feet): 200 Feet Assistive device: None Gait Pattern/deviations: Step-through pattern Gait velocity: decreased General Gait Details: pt with guarded gait with increased lateral excursion due to body girth Stairs: Yes Stairs assistance: Modified independent (Device/Increase time) Stair Management: One rail Right;Alternating pattern;Forwards Number of Stairs: 5 General stair comments: safe performance of  stairs    Exercises Other Exercises Other Exercises: discussed return to activity including walking and water walking and aerobics   PT Diagnosis: Acute pain;Difficulty walking  PT Problem List: Decreased mobility;Obesity;Pain;Decreased knowledge of precautions PT Treatment Interventions:       PT Goals(Current goals can be found in the care plan section) Acute Rehab PT Goals Patient Stated Goal: decreased pain PT Goal Formulation: No goals set, d/c therapy  Visit Information  Last PT Received On: 12/05/13 Assistance Needed: +1 History of Present Illness: Pt presents with lumbar herniation L5-S1. Underwent laminectomy/ microdiscectomy.       Prior [place] expects to be discharged to:: Private residence Living Arrangements: Spouse/significant other;Children Available Help at Discharge: Family;Available PRN/intermittently Type of Home: House Home Access: Stairs to enter [employer] of Steps: 6 Entrance Stairs-Rails: Right Home Layout: One level Home Equipment: None Prior Function Level of Independence: Independent Comments: pt works at a [employer] day Communication Communication: No difficulties    Cognition  Cognition Arousal/Alertness: Awake/alert Behavior During Therapy: WFL for tasks assessed/performed Overall Cognitive Status: Within Functional Limits for tasks assessed    Extremity/Trunk Assessment Upper Extremity Assessment Upper Extremity Assessment: Overall WFL for tasks assessed Lower Extremity Assessment Lower Extremity Assessment: Overall WFL for tasks assessed Cervical / Trunk Assessment Cervical / Trunk Assessment: Normal   Balance Balance Overall balance assessment: No apparent balance deficits (not formally assessed)  End of Session PT - End of Session Activity Tolerance: Patient tolerated treatment well Patient left: in bed;with call bell/phone within reach;with family/visitor present Nurse  Communication: Mobility status  GP Functional Assessment Tool Used: clinical judgement Functional Limitation: Mobility: Walking and moving around Mobility: Walking and Moving Around Current Status ([id number]): 0 percent impaired, limited or restricted Mobility: Walking and Moving  Around Goal Status [phone]): 0 percent impaired, limited or restricted Mobility: Walking and Moving Around Discharge Status [phone]): 0 percent impaired, limited or restricted  Leighton Roach, PT  Acute Rehab Services  [phone]  Leighton Roach 12/05/2013, 3:05 PM

## 2013-12-05 NOTE — Discharge Summary (Signed)
Physician Discharge Summary  Patient ID: Jackie Reynolds MRN: [id number] DOB/AGE: [birth year] 43 y.o.  Admit date: 12/05/2013 Discharge date: 12/05/2013  Admission Diagnoses: Right L5-S1 herniated disc, lumbago, lumbar radiculopathy  Discharge Diagnoses: The same Active Problems:   Lumbar herniated disc   Discharged Condition: good  Hospital Course: I performed a right L5-S1 discectomy on the patient on 12/05/2013. The surgery went well. Patient's postoperative course was unremarkable. She requested discharge to home. She was given oral and written discharge instructions. All her questions were answered.  Consults: None Significant Diagnostic Studies: None Treatments: Right L5-S1 discectomy using microdissection Discharge Exam: Blood pressure 138/80, pulse 62, temperature 97.9 F (36.6 C), temperature source Oral, resp. rate 20, SpO2 96.00%. The patient is alert and pleasant. She looks well. She is in no apparent distress. Her strength is normal.  Disposition: Home  Discharge Orders   Future Orders Complete By Expires   Call MD for:  difficulty breathing, headache or visual disturbances  As directed    Call MD for:  extreme fatigue  As directed    Call MD for:  hives  As directed    Call MD for:  persistant dizziness or light-headedness  As directed    Call MD for:  persistant nausea and vomiting  As directed    Call MD for:  redness, tenderness, or signs of infection (pain, swelling, redness, odor or green/yellow discharge around incision site)  As directed    Call MD for:  severe uncontrolled pain  As directed    Call MD for:  temperature >100.4  As directed    Diet - low sodium heart healthy  As directed    Discharge instructions  As directed    Comments:     Call [phone] for a followup appointment. Take a stool softener while you are using pain medications.   Driving Restrictions  As directed    Comments:     Do not drive for 2 weeks.   Increase activity slowly   As directed    Lifting restrictions  As directed    Comments:     Do not lift more than 5 pounds. No excessive bending or twisting.   May shower / Bathe  As directed    Comments:     He may shower after the pain she is removed 3 days after surgery. Leave the incision alone.   Remove dressing in 48 hours  As directed    Comments:     Your stitches are under the scan and will dissolve by themselves. The Steri-Strips will fall off after you take a few showers. Do not rub back or pick at the wound, Leave the wound alone.       Medication List    STOP taking these medications       HYDROcodone-acetaminophen 10-325 MG per tablet  Commonly known as:  NORCO      TAKE these medications       CVS VIT D 5000 HIGH-POTENCY PO  Take 5,000 Units by mouth daily.     diazepam 5 MG tablet  Commonly known as:  VALIUM  Take 1 tablet (5 mg total) by mouth every 6 (six) hours as needed for muscle spasms.     DSS 100 MG Caps  Take 100 mg by mouth 2 (two) times daily.     ibuprofen 200 MG tablet  Commonly known as:  ADVIL,MOTRIN  Take 600 mg by mouth every 6 (six) hours as needed for moderate pain.  levothyroxine 175 MCG tablet  Commonly known as:  SYNTHROID, LEVOTHROID  Take 175 mcg by mouth daily before breakfast.     levothyroxine 150 MCG tablet  Commonly known as:  SYNTHROID, LEVOTHROID  Take 150 mcg by mouth daily before breakfast.     OVER THE COUNTER MEDICATION  Place 1 spray into both nostrils daily as needed (congestion). Generic form on zicam     oxyCODONE-acetaminophen 10-325 MG per tablet  Commonly known as:  PERCOCET  Take 1 tablet by mouth every 4 (four) hours as needed for pain.         SignedTressie Stalker: Jackie Reynolds D 12/05/2013, 1:01 PM

## 2013-12-05 NOTE — Anesthesia Preprocedure Evaluation (Signed)
Anesthesia Evaluation  Patient identified by MRN, date of birth, ID band Patient awake    Reviewed: Allergy & Precautions, H&P , NPO status , Patient's Chart, lab work & pertinent test results, reviewed documented beta blocker date and time   Airway Mallampati: II TM Distance: >3 FB Neck ROM: full    Dental   Pulmonary sleep apnea ,  breath sounds clear to auscultation        Cardiovascular negative cardio ROS  Rhythm:regular     Neuro/Psych  Headaches, negative psych ROS   GI/Hepatic negative GI ROS, Neg liver ROS,   Endo/Other  Hypothyroidism Morbid obesity  Renal/GU negative Renal ROS  negative genitourinary   Musculoskeletal   Abdominal   Peds  Hematology negative hematology ROS (+)   Anesthesia Other Findings See surgeon's H&P   Reproductive/Obstetrics negative OB ROS                           Anesthesia Physical Anesthesia Plan  ASA: III  Anesthesia Plan: General   Post-op Pain Management:    Induction: Intravenous  Airway Management Planned: Oral ETT  Additional Equipment:   Intra-op Plan:   Post-operative Plan: Extubation in OR  Informed Consent: I have reviewed the patients History and Physical, chart, labs and discussed the procedure including the risks, benefits and alternatives for the proposed anesthesia with the patient or authorized representative who has indicated his/her understanding and acceptance.   Dental Advisory Given  Plan Discussed with: CRNA and Surgeon  Anesthesia Plan Comments:         Anesthesia Quick Evaluation

## 2013-12-05 NOTE — Preoperative (Signed)
Beta Blockers   Reason not to administer Beta Blockers:Not Applicable 

## 2013-12-06 ENCOUNTER — Encounter (HOSPITAL_COMMUNITY): Payer: Self-pay | Admitting: Neurosurgery

## 2014-04-12 ENCOUNTER — Ambulatory Visit: Payer: Self-pay | Admitting: Bariatrics

## 2014-11-01 ENCOUNTER — Ambulatory Visit: Payer: Self-pay | Admitting: Internal Medicine

## 2016-08-31 DIAGNOSIS — E039 Hypothyroidism, unspecified: Secondary | ICD-10-CM | POA: Insufficient documentation

## 2016-08-31 DIAGNOSIS — G4733 Obstructive sleep apnea (adult) (pediatric): Secondary | ICD-10-CM | POA: Insufficient documentation

## 2016-10-26 DIAGNOSIS — Z90710 Acquired absence of both cervix and uterus: Secondary | ICD-10-CM | POA: Insufficient documentation

## 2016-10-27 ENCOUNTER — Other Ambulatory Visit: Payer: Self-pay | Admitting: Internal Medicine

## 2016-10-27 DIAGNOSIS — Z1231 Encounter for screening mammogram for malignant neoplasm of breast: Secondary | ICD-10-CM

## 2016-11-29 ENCOUNTER — Ambulatory Visit
Admission: RE | Admit: 2016-11-29 | Discharge: 2016-11-29 | Disposition: A | Discharge status: Home / Self Care | Payer: 59 | Source: Ambulatory Visit | Attending: Internal Medicine | Admitting: Internal Medicine

## 2016-11-29 DIAGNOSIS — Z1231 Encounter for screening mammogram for malignant neoplasm of breast: Secondary | ICD-10-CM | POA: Insufficient documentation

## 2017-11-10 ENCOUNTER — Other Ambulatory Visit: Payer: Self-pay | Admitting: Internal Medicine

## 2017-11-10 DIAGNOSIS — Z1231 Encounter for screening mammogram for malignant neoplasm of breast: Secondary | ICD-10-CM

## 2017-12-06 ENCOUNTER — Ambulatory Visit
Admission: RE | Admit: 2017-12-06 | Discharge: 2017-12-06 | Disposition: A | Discharge status: Home / Self Care | Payer: Managed Care, Other (non HMO) | Source: Ambulatory Visit | Attending: Internal Medicine | Admitting: Internal Medicine

## 2017-12-06 DIAGNOSIS — Z1231 Encounter for screening mammogram for malignant neoplasm of breast: Secondary | ICD-10-CM | POA: Diagnosis not present

## 2018-04-16 ENCOUNTER — Emergency Department
Admission: EM | Admit: 2018-04-16 | Discharge: 2018-04-16 | Disposition: A | Discharge status: Home / Self Care | Payer: Managed Care, Other (non HMO) | Attending: Emergency Medicine | Admitting: Emergency Medicine

## 2018-04-16 ENCOUNTER — Other Ambulatory Visit: Payer: Self-pay

## 2018-04-16 DIAGNOSIS — M436 Torticollis: Secondary | ICD-10-CM | POA: Diagnosis present

## 2018-04-16 DIAGNOSIS — Z5321 Procedure and treatment not carried out due to patient leaving prior to being seen by health care provider: Secondary | ICD-10-CM | POA: Diagnosis not present

## 2018-04-16 NOTE — ED Notes (Signed)
Pt seen leaving the department and getting into her car in the parking lot-left without being seen

## 2018-04-16 NOTE — ED Triage Notes (Signed)
Patient reports she was unable to get comfortable to go to sleep.  Reports pain left side of neck into left upper back and down left arm.  Patient denies any type of injury or any other symptoms.

## 2019-04-02 DIAGNOSIS — R600 Localized edema: Secondary | ICD-10-CM | POA: Insufficient documentation

## 2020-02-29 ENCOUNTER — Other Ambulatory Visit: Payer: Self-pay | Admitting: Gastroenterology

## 2020-02-29 DIAGNOSIS — K760 Fatty (change of) liver, not elsewhere classified: Secondary | ICD-10-CM

## 2020-02-29 DIAGNOSIS — R7401 Elevation of levels of liver transaminase levels: Secondary | ICD-10-CM

## 2020-03-17 ENCOUNTER — Ambulatory Visit: Payer: Managed Care, Other (non HMO)

## 2020-03-18 ENCOUNTER — Other Ambulatory Visit: Payer: Self-pay | Admitting: Internal Medicine

## 2020-03-18 DIAGNOSIS — Z1231 Encounter for screening mammogram for malignant neoplasm of breast: Secondary | ICD-10-CM

## 2020-03-25 ENCOUNTER — Ambulatory Visit
Admission: RE | Admit: 2020-03-25 | Discharge: 2020-03-25 | Disposition: A | Discharge status: Home / Self Care | Payer: Managed Care, Other (non HMO) | Source: Ambulatory Visit | Attending: Gastroenterology | Admitting: Gastroenterology

## 2020-03-25 ENCOUNTER — Other Ambulatory Visit: Payer: Self-pay

## 2020-03-25 DIAGNOSIS — R7401 Elevation of levels of liver transaminase levels: Secondary | ICD-10-CM | POA: Diagnosis present

## 2020-03-25 DIAGNOSIS — K760 Fatty (change of) liver, not elsewhere classified: Secondary | ICD-10-CM | POA: Insufficient documentation

## 2020-04-16 ENCOUNTER — Ambulatory Visit
Admission: RE | Admit: 2020-04-16 | Discharge: 2020-04-16 | Disposition: A | Discharge status: Home / Self Care | Payer: Managed Care, Other (non HMO) | Source: Ambulatory Visit | Attending: Internal Medicine | Admitting: Internal Medicine

## 2020-04-16 DIAGNOSIS — Z1231 Encounter for screening mammogram for malignant neoplasm of breast: Secondary | ICD-10-CM | POA: Insufficient documentation

## 2020-06-18 ENCOUNTER — Other Ambulatory Visit: Admission: RE | Admit: 2020-06-18 | Payer: Managed Care, Other (non HMO) | Source: Ambulatory Visit

## 2020-08-13 ENCOUNTER — Other Ambulatory Visit: Payer: Managed Care, Other (non HMO) | Attending: Gastroenterology

## 2020-09-03 DIAGNOSIS — E559 Vitamin D deficiency, unspecified: Secondary | ICD-10-CM | POA: Insufficient documentation

## 2020-09-03 DIAGNOSIS — R748 Abnormal levels of other serum enzymes: Secondary | ICD-10-CM | POA: Insufficient documentation

## 2020-09-03 DIAGNOSIS — R600 Localized edema: Secondary | ICD-10-CM | POA: Insufficient documentation

## 2020-09-03 DIAGNOSIS — R109 Unspecified abdominal pain: Secondary | ICD-10-CM | POA: Insufficient documentation

## 2020-09-03 DIAGNOSIS — M2550 Pain in unspecified joint: Secondary | ICD-10-CM | POA: Insufficient documentation

## 2020-09-03 DIAGNOSIS — E7800 Pure hypercholesterolemia, unspecified: Secondary | ICD-10-CM | POA: Insufficient documentation

## 2020-09-03 DIAGNOSIS — M5136 Other intervertebral disc degeneration, lumbar region: Secondary | ICD-10-CM | POA: Insufficient documentation

## 2020-09-04 ENCOUNTER — Other Ambulatory Visit: Payer: Self-pay

## 2020-09-04 ENCOUNTER — Other Ambulatory Visit: Payer: Self-pay | Admitting: Podiatry

## 2020-09-04 ENCOUNTER — Ambulatory Visit (INDEPENDENT_AMBULATORY_CARE_PROVIDER_SITE_OTHER): Payer: Managed Care, Other (non HMO)

## 2020-09-04 ENCOUNTER — Ambulatory Visit: Payer: Managed Care, Other (non HMO) | Admitting: Podiatry

## 2020-09-04 ENCOUNTER — Encounter: Payer: Self-pay | Admitting: Podiatry

## 2020-09-04 DIAGNOSIS — M778 Other enthesopathies, not elsewhere classified: Secondary | ICD-10-CM

## 2020-09-04 DIAGNOSIS — M779 Enthesopathy, unspecified: Secondary | ICD-10-CM

## 2020-09-04 NOTE — Progress Notes (Signed)
Subjective:  Patient ID: Jackie Reynolds, female    DOB: [birth year],  MRN: [id number]  Chief Complaint  Patient presents with  . Foot Pain    Patient presents today for pain in right foot   "On Thanksgivining day, I felt a pop in the top of my foot and since its been painful, burning aches and sharp when I walk.  It been puffy on the top"    49 y.o. female presents with the above complaint.  Patient presents with complaint of right dorsal foot pain in the forefoot.  Patient states it started on Thanksgiving day and has been hurting since then.  There is some sharp and burning pain associate with it.  Has been puffy on top as well.  She does not recall any kind of injury.  She has not seen anyone else prior to seeing me.  She went to an urgent care for which she treated with a sprain and put her on ibuprofen.  She denies any other acute complaints.  She would like to discuss treatment options.  She wants to make sure nothing is broken   Review of Systems: Negative except as noted in the HPI. Denies N/V/F/Ch.  Past Medical History:  Diagnosis Date  . Anemia    Hx: of   . Arthritis   . Complication of anesthesia   . Headache(784.0)   . Hypothyroidism   . Numbness    Hx: of right leg  . PONV (postoperative nausea and vomiting)   . Sleep apnea    Non- compliant    Current Outpatient Medications:  .  furosemide (LASIX) 40 MG tablet, Take by mouth., Disp: , Rfl:  .  cetirizine (ZYRTEC) 10 MG tablet, Take by mouth., Disp: , Rfl:  .  Cholecalciferol ([employer] VIT D 5000 HIGH-POTENCY PO), Take 5,000 Units by mouth daily., Disp: , Rfl:  .  ibuprofen (ADVIL,MOTRIN) 200 MG tablet, Take 600 mg by mouth every 6 (six) hours as needed for moderate pain., Disp: , Rfl:  .  levothyroxine (SYNTHROID, LEVOTHROID) 175 MCG tablet, Take 175 mcg by mouth daily before breakfast., Disp: , Rfl:  .  omeprazole (PRILOSEC) 20 MG capsule, Take by mouth., Disp: , Rfl:  .  OVER THE COUNTER MEDICATION, Place 1  spray into both nostrils daily as needed (congestion). Generic form on zicam, Disp: , Rfl:   Social History   Tobacco Use  Smoking Status Never Smoker  Smokeless Tobacco Never Used    Allergies  Allergen Reactions  . Biaxin [Clarithromycin] Nausea Only  . Duloxetine Nausea Only  . Pregabalin Hives   Objective:  There were no vitals filed for this visit. There is no height or weight on file to calculate BMI. Constitutional Well developed. Well nourished.  Vascular Dorsalis pedis pulses palpable bilaterally. Posterior tibial pulses palpable bilaterally. Capillary refill normal to all digits.  No cyanosis or clubbing noted. Pedal hair growth normal.  Neurologic Normal speech. Oriented to person, place, and time. Epicritic sensation to light touch grossly present bilaterally.  Dermatologic Nails well groomed and normal in appearance. No open wounds. No skin lesions.  Orthopedic:  Pain on palpation to the dorsal second metatarsal and third metatarsal.  Mild pain with resisted dorsiflexion of the digits.  No pain with plantarflexion of the digit resisted.  No pain from range of motion of the second at the third digit MPJ.  No neuroma noted.  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  Mild osteoarthritic changes noted of the subtalar joint and the ankle joint.  No stress fracture noted.  No fractures noted.  No other bony abnormalities identified Assessment:   1. Extensor tendinitis of foot    Plan:  Patient was evaluated and treated and all questions answered.  Right extensor tendinitis versus a possible stress fracture -I explained to the patient the etiology of tendinitis and various treatment options were discussed.  I believe patient will benefit from cam boot immobilization given the  amount of pain that she is having.  This will help immobilize and rest her foot.  I encouraged elevation as well to help with the swelling.  I have we will plan on placing her in the boot for 4 weeks. -She can continue taking ibuprofen for pain control. -If there is no improvement we will discuss steroid injection versus an MRI.  No follow-ups on file.

## 2020-10-07 ENCOUNTER — Ambulatory Visit: Payer: Managed Care, Other (non HMO) | Admitting: Podiatry

## 2020-10-15 ENCOUNTER — Other Ambulatory Visit
Admission: RE | Admit: 2020-10-15 | Discharge: 2020-10-15 | Disposition: A | Discharge status: Home / Self Care | Payer: Managed Care, Other (non HMO) | Source: Ambulatory Visit | Attending: Gastroenterology | Admitting: Gastroenterology

## 2020-10-15 ENCOUNTER — Other Ambulatory Visit: Payer: Self-pay

## 2020-10-15 DIAGNOSIS — Z01812 Encounter for preprocedural laboratory examination: Secondary | ICD-10-CM | POA: Insufficient documentation

## 2020-10-15 DIAGNOSIS — U071 COVID-19: Secondary | ICD-10-CM | POA: Diagnosis not present

## 2020-10-16 LAB — SARS CORONAVIRUS 2 (TAT 6-24 HRS): SARS Coronavirus 2: POSITIVE — AB

## 2020-11-26 ENCOUNTER — Other Ambulatory Visit: Payer: Managed Care, Other (non HMO)

## 2020-11-27 ENCOUNTER — Encounter: Payer: Self-pay | Admitting: *Deleted

## 2020-11-28 ENCOUNTER — Encounter: Payer: Self-pay | Admitting: *Deleted

## 2020-11-28 ENCOUNTER — Ambulatory Visit: Payer: Managed Care, Other (non HMO) | Admitting: Certified Registered Nurse Anesthetist

## 2020-11-28 ENCOUNTER — Encounter
Admission: RE | Disposition: A | Discharge status: Home / Self Care | Payer: Self-pay | Source: Home / Self Care | Attending: Gastroenterology

## 2020-11-28 ENCOUNTER — Ambulatory Visit
Admission: RE | Admit: 2020-11-28 | Discharge: 2020-11-28 | Disposition: A | Discharge status: Home / Self Care | Payer: Managed Care, Other (non HMO) | Attending: Gastroenterology | Admitting: Gastroenterology

## 2020-11-28 ENCOUNTER — Other Ambulatory Visit: Payer: Self-pay

## 2020-11-28 DIAGNOSIS — K640 First degree hemorrhoids: Secondary | ICD-10-CM | POA: Diagnosis not present

## 2020-11-28 DIAGNOSIS — Z881 Allergy status to other antibiotic agents status: Secondary | ICD-10-CM | POA: Insufficient documentation

## 2020-11-28 DIAGNOSIS — Z79890 Hormone replacement therapy (postmenopausal): Secondary | ICD-10-CM | POA: Diagnosis not present

## 2020-11-28 DIAGNOSIS — Z79899 Other long term (current) drug therapy: Secondary | ICD-10-CM | POA: Insufficient documentation

## 2020-11-28 DIAGNOSIS — Z888 Allergy status to other drugs, medicaments and biological substances status: Secondary | ICD-10-CM | POA: Insufficient documentation

## 2020-11-28 DIAGNOSIS — Z1211 Encounter for screening for malignant neoplasm of colon: Secondary | ICD-10-CM | POA: Insufficient documentation

## 2020-11-28 HISTORY — DX: Abnormal levels of other serum enzymes: R74.8

## 2020-11-28 HISTORY — PX: COLONOSCOPY WITH PROPOFOL: SHX5780

## 2020-11-28 HISTORY — DX: Vitamin D deficiency, unspecified: E55.9

## 2020-11-28 HISTORY — DX: Metabolic syndrome: E88.81

## 2020-11-28 HISTORY — DX: Fatty (change of) liver, not elsewhere classified: K76.0

## 2020-11-28 HISTORY — DX: Other intervertebral disc degeneration, lumbar region without mention of lumbar back pain or lower extremity pain: M51.369

## 2020-11-28 HISTORY — DX: Morbid (severe) obesity due to excess calories: E66.01

## 2020-11-28 HISTORY — DX: Other intervertebral disc degeneration, lumbar region: M51.36

## 2020-11-28 HISTORY — DX: Metabolic syndrome: E88.810

## 2020-11-28 SURGERY — COLONOSCOPY WITH PROPOFOL
Anesthesia: General

## 2020-11-28 MED ORDER — SODIUM CHLORIDE 0.9 % IV SOLN: INTRAVENOUS | Status: DC

## 2020-11-28 MED ORDER — PROPOFOL 10 MG/ML IV BOLUS
INTRAVENOUS | Status: DC | PRN
  Administered 2020-11-28: 20 mg via INTRAVENOUS
  Administered 2020-11-28: 80 mg via INTRAVENOUS

## 2020-11-28 MED ORDER — PROPOFOL 500 MG/50ML IV EMUL
INTRAVENOUS | Status: AC
  Filled 2020-11-28: qty 50

## 2020-11-28 MED ORDER — PROPOFOL 500 MG/50ML IV EMUL
INTRAVENOUS | Status: DC | PRN
  Administered 2020-11-28: 180 ug/kg/min via INTRAVENOUS

## 2020-11-28 NOTE — Anesthesia Procedure Notes (Signed)
Performed by: Nemiah Bubar, CRNA Oxygen Delivery Method: Supernova nasal CPAP       

## 2020-11-28 NOTE — Op Note (Signed)
[facility] Gastroenterology Patient Name: Jackie Reynolds Procedure Date: 11/28/2020 11:15 AM MRN: [id number] Account #: [id number] Date of Birth: 1971/02/27 Admit Type: Outpatient Age: 50 Room: [facility] ENDO ROOM 3 Gender: Female Note Status: Finalized Procedure:             Colonoscopy Indications:           Screening for colorectal malignant neoplasm Providers:             Andrey Farmer MD, MD Medicines:             Monitored Anesthesia Care Complications:         No immediate complications. Procedure:             Pre-Anesthesia Assessment:                        - Prior to the procedure, a History and Physical was                         performed, and patient medications and allergies were                         reviewed. The patient is competent. The risks and                         benefits of the procedure and the sedation options and                         risks were discussed with the patient. All questions                         were answered and informed consent was obtained.                         Patient identification and proposed procedure were                         verified by the physician, the nurse, the anesthetist                         and the technician in the endoscopy suite. Mental                         Status Examination: alert and oriented. Airway                         Examination: normal oropharyngeal airway and neck                         mobility. Respiratory Examination: clear to                         auscultation. CV Examination: normal. Prophylactic                         Antibiotics: The patient does not require prophylactic                         antibiotics. Prior Anticoagulants: The patient has  taken no previous anticoagulant or antiplatelet                         agents. ASA Grade Assessment: III - A patient with                         severe systemic disease. After reviewing the  risks and                         benefits, the patient was deemed in satisfactory                         condition to undergo the procedure. The anesthesia                         plan was to use monitored anesthesia care (MAC).                         Immediately prior to administration of medications,                         the patient was re-assessed for adequacy to receive                         sedatives. The heart rate, respiratory rate, oxygen                         saturations, blood pressure, adequacy of pulmonary                         ventilation, and response to care were monitored                         throughout the procedure. The physical status of the                         patient was re-assessed after the procedure.                        After obtaining informed consent, the colonoscope was                         passed under direct vision. Throughout the procedure,                         the patient's blood pressure, pulse, and oxygen                         saturations were monitored continuously. The                         Colonoscope was introduced through the anus and                         advanced to the the cecum, identified by appendiceal                         orifice and ileocecal valve. The colonoscopy was  performed without difficulty. The patient tolerated                         the procedure well. The quality of the bowel                         preparation was good. Findings:      The perianal and digital rectal examinations were normal.      The colon (entire examined portion) appeared normal.      Internal hemorrhoids were found during retroflexion. The hemorrhoids       were Grade I (internal hemorrhoids that do not prolapse).      No additional abnormalities were found on retroflexion. Impression:            - The entire examined colon is normal.                        - Internal hemorrhoids.                         - No specimens collected. Recommendation:        - Discharge patient to home.                        - Resume previous diet.                        - Continue present medications.                        - Repeat colonoscopy in 10 years for screening                         purposes.                        - Return to referring physician as previously                         scheduled. Procedure Code(s):     --- Professional ---                        O3500, Colorectal cancer screening; colonoscopy on                         individual not meeting criteria for high risk Diagnosis Code(s):     --- Professional ---                        Z12.11, Encounter for screening for malignant neoplasm                         of colon                        K64.0, First degree hemorrhoids CPT copyright 2019 [employer]. All rights reserved. The codes documented in this report are preliminary and upon coder review may  be revised to meet current compliance requirements. Andrey Farmer MD, MD 11/28/2020 11:50:12 AM Number of Addenda: 0 Note Initiated On: 11/28/2020 11:15 AM Scope Withdrawal Time: 0 hours 7 minutes 8 seconds  Total Procedure Duration: 0 hours 13 minutes 17 seconds  Estimated Blood Loss:  Estimated blood loss: none.      [facility]

## 2020-11-28 NOTE — Interval H&P Note (Signed)
History and Physical Interval Note:  11/28/2020 11:25 AM  Jackie Reynolds  has presented today for surgery, with the diagnosis of COLON CANCER SCREENING.  The various methods of treatment have been discussed with the patient and family. After consideration of risks, benefits and other options for treatment, the patient has consented to  Procedure(s) with comments: COLONOSCOPY WITH PROPOFOL (N/A) - COVID POSITIVE 10/15/2020 as a surgical intervention.  The patient's history has been reviewed, patient examined, no change in status, stable for surgery.  I have reviewed the patient's chart and labs.  Questions were answered to the patient's satisfaction.     Regis Bill  Ok to proceed with colonoscopy

## 2020-11-28 NOTE — Transfer of Care (Addendum)
Immediate Anesthesia Transfer of Care Note  Patient: Jackie Reynolds  Procedure(s) Performed: COLONOSCOPY WITH PROPOFOL (N/A )  Patient Location: PACU  Anesthesia Type:General  Level of Consciousness: drowsy  Airway & Oxygen Therapy: Patient Spontanous Breathing and Patient connected to face mask oxygen  Post-op Assessment: Report given to RN and Post -op Vital signs reviewed and stable  Post vital signs: Reviewed and stable  Last Vitals:  Vitals Value Taken Time  BP 116/74   Temp    Pulse 80 11/28/20 1153  Resp 28 11/28/20 1153  SpO2 100 % 11/28/20 1153    Last Pain:  Vitals:   11/28/20 1036  TempSrc: Temporal  PainSc: 0-No pain         Complications: No complications documented.

## 2020-11-28 NOTE — H&P (Signed)
Outpatient short stay form Pre-procedure 11/28/2020 11:23 AM Merlyn Lot MD, MPH  Primary Physician: Dr. Graciela Husbands  Reason for visit:  Screening Colonoscopy  History of present illness:   50 y/o lady with history of hypothyroidism here for index screening colonoscopy. No family history of GI malignancies. History of hysterectomy and cholecystectomy. No blood thinners.    Current Facility-Administered Medications:  .  0.9 %  sodium chloride infusion, , Intravenous, Continuous, Ambur Province, Rossie Muskrat, MD, Last Rate: 20 mL/hr at 11/28/20 1040, New Bag at 11/28/20 1040  Medications Prior to Admission  Medication Sig Dispense Refill Last Dose  . cetirizine (ZYRTEC) 10 MG tablet Take by mouth.   Past Month at Unknown time  . Cholecalciferol ([employer] VIT D 5000 HIGH-POTENCY PO) Take 5,000 Units by mouth daily.   11/27/2020 at 0800  . furosemide (LASIX) 40 MG tablet Take by mouth.   Past Month at Unknown time  . ibuprofen (ADVIL,MOTRIN) 200 MG tablet Take 600 mg by mouth every 6 (six) hours as needed for moderate pain.   Past Week at Unknown time  . levothyroxine (SYNTHROID, LEVOTHROID) 175 MCG tablet Take 175 mcg by mouth daily before breakfast.   11/27/2020 at 0800  . omeprazole (PRILOSEC) 20 MG capsule Take by mouth.   11/27/2020 at 0800  . OVER THE COUNTER MEDICATION Place 1 spray into both nostrils daily as needed (congestion). Generic form on zicam   Past Week at Unknown time     Allergies  Allergen Reactions  . Biaxin [Clarithromycin] Nausea Only  . Duloxetine Nausea Only  . Pregabalin Hives  . Hydralazine Rash     Past Medical History:  Diagnosis Date  . Anemia    Hx: of   . Arthritis   . Complication of anesthesia   . Degenerative disc disease, lumbar   . Elevated liver enzymes   . Headache(784.0)   . Hypothyroidism   . Metabolic syndrome   . Morbid obesity (HCC)   . Non-alcoholic fatty liver disease   . Numbness    Hx: of right leg  . PONV (postoperative nausea and  vomiting)   . Sleep apnea    Non- compliant  . Vitamin D deficiency     Review of systems:  Otherwise negative.    Physical Exam  Gen: Alert, oriented. Appears stated age.  HEENT:  PERRLA. Lungs: No respiratory distress CV: RRR Abd: soft, benign, no masses Ext: No edema    Planned procedures: Proceed with colonoscopy. The patient understands the nature of the planned procedure, indications, risks, alternatives and potential complications including but not limited to bleeding, infection, perforation, damage to internal organs and possible oversedation/side effects from anesthesia. The patient agrees and gives consent to proceed.  Please refer to procedure notes for findings, recommendations and patient disposition/instructions.     Merlyn Lot MD, MPH Gastroenterology 11/28/2020  11:23 AM

## 2020-11-28 NOTE — Anesthesia Preprocedure Evaluation (Addendum)
Anesthesia Evaluation  Patient identified by MRN, date of birth, ID band Patient awake    Reviewed: Allergy & Precautions, H&P , NPO status , Patient's Chart, lab work & pertinent test results, reviewed documented beta blocker date and time   History of Anesthesia Complications (+) PONV and history of anesthetic complications  Airway Mallampati: III  TM Distance: <3 FB Neck ROM: full    Dental  (+) Teeth Intact   Pulmonary sleep apnea and Continuous Positive Airway Pressure Ventilation ,    breath sounds clear to auscultation       Cardiovascular negative cardio ROS   Rhythm:regular     Neuro/Psych  Headaches, negative psych ROS   GI/Hepatic negative GI ROS, Neg liver ROS,   Endo/Other  Hypothyroidism Morbid obesity  Renal/GU negative Renal ROS  negative genitourinary   Musculoskeletal  (+) Arthritis , Osteoarthritis,    Abdominal   Peds  Hematology negative hematology ROS (+) anemia ,   Anesthesia Other Findings See surgeon's H&P   Reproductive/Obstetrics negative OB ROS                            Anesthesia Physical  Anesthesia Plan  ASA: III  Anesthesia Plan: General   Post-op Pain Management:    Induction: Intravenous  PONV Risk Score and Plan:   Airway Management Planned: Nasal Cannula  Additional Equipment:   Intra-op Plan:   Post-operative Plan:   Informed Consent: I have reviewed the patients History and Physical, chart, labs and discussed the procedure including the risks, benefits and alternatives for the proposed anesthesia with the patient or authorized representative who has indicated his/her understanding and acceptance.     Dental Advisory Given  Plan Discussed with: CRNA and Surgeon  Anesthesia Plan Comments:         Anesthesia Quick Evaluation

## 2020-11-30 NOTE — Anesthesia Postprocedure Evaluation (Signed)
Anesthesia Post Note  Patient: Jackie Reynolds  Procedure(s) Performed: COLONOSCOPY WITH PROPOFOL (N/A )  Patient location during evaluation: Endoscopy Anesthesia Type: General Level of consciousness: awake and alert and oriented Pain management: pain level controlled Vital Signs Assessment: post-procedure vital signs reviewed and stable Respiratory status: spontaneous breathing Cardiovascular status: blood pressure returned to baseline Anesthetic complications: no   No complications documented.   Last Vitals:  Vitals:   11/28/20 1213 11/28/20 1223  BP: 127/88 133/85  Pulse: 88 81  Resp: 18 18  Temp:    SpO2: 100% 100%    Last Pain:  Vitals:   11/29/20 1142  TempSrc:   PainSc: 0-No pain                 CARROLL,PAUL

## 2020-12-01 ENCOUNTER — Encounter: Payer: Self-pay | Admitting: Gastroenterology

## 2021-12-28 ENCOUNTER — Other Ambulatory Visit: Payer: Self-pay | Admitting: Internal Medicine

## 2021-12-28 DIAGNOSIS — Z1231 Encounter for screening mammogram for malignant neoplasm of breast: Secondary | ICD-10-CM

## 2022-02-02 ENCOUNTER — Ambulatory Visit
Admission: RE | Admit: 2022-02-02 | Discharge: 2022-02-02 | Disposition: A | Discharge status: Home / Self Care | Payer: Managed Care, Other (non HMO) | Source: Ambulatory Visit | Attending: Internal Medicine | Admitting: Internal Medicine

## 2022-02-02 DIAGNOSIS — Z1231 Encounter for screening mammogram for malignant neoplasm of breast: Secondary | ICD-10-CM | POA: Insufficient documentation

## 2022-12-24 IMAGING — MG MM DIGITAL SCREENING BILAT W/ TOMO AND CAD
8 of 14 series · 8 of 40 positions shown · non-contrast
Comparison: Previous exam(s).

ACR Breast Density Category a: The breast tissue is almost entirely
fatty.

CLINICAL DATA: Screening.

EXAM:
DIGITAL SCREENING BILATERAL MAMMOGRAM WITH TOMOSYNTHESIS AND CAD
TECHNIQUE: Bilateral screening digital craniocaudal and mediolateral oblique
mammograms were obtained. Bilateral screening digital breast
tomosynthesis was performed. The images were evaluated with
computer-aided detection.

[L CC synth-2D (1 of 2)]
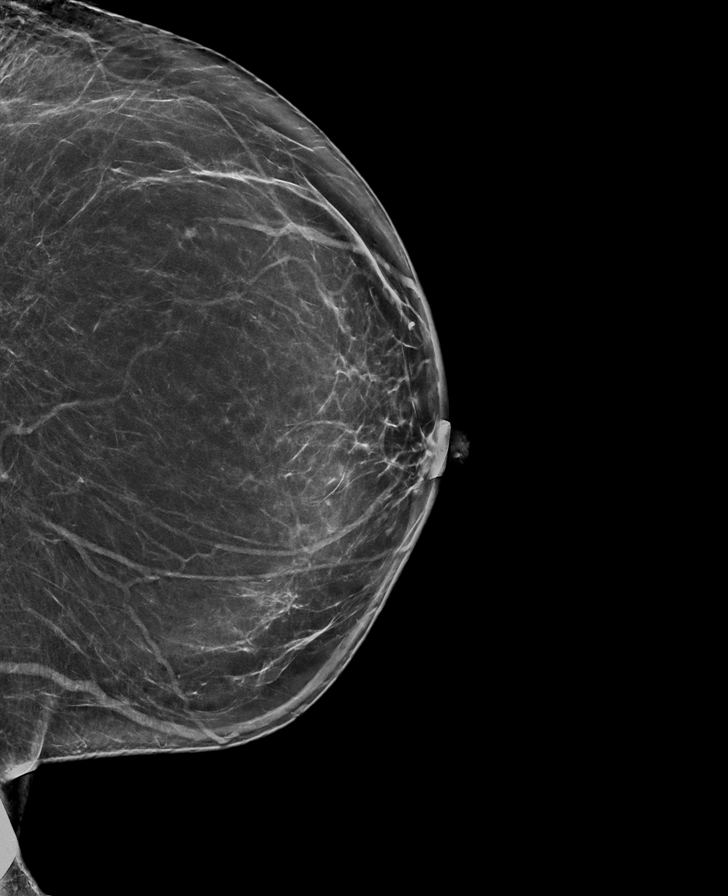

[R CC synth-2D (1 of 2)]
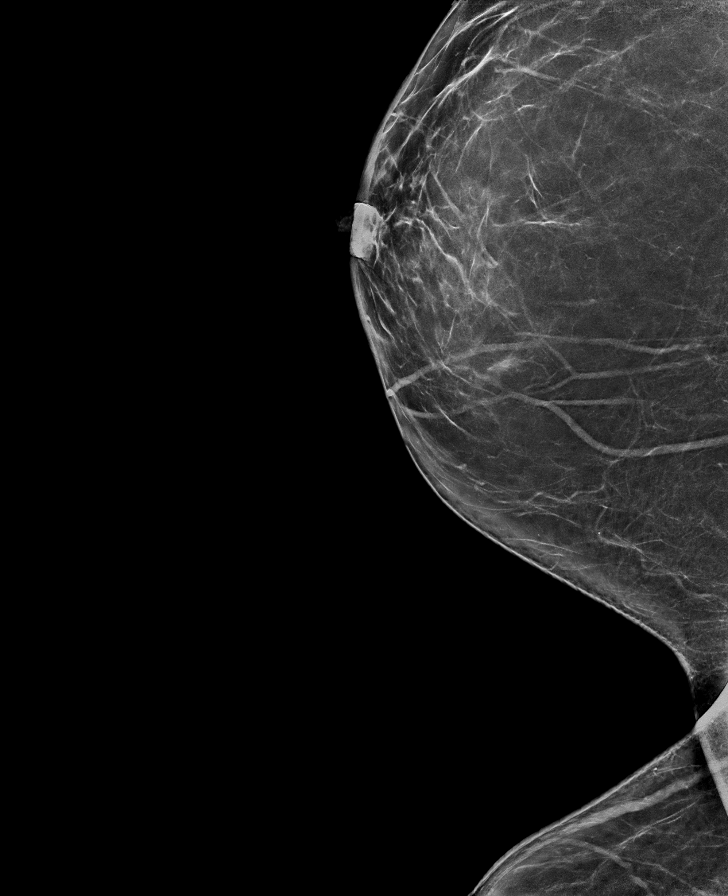

[R MLO synth-2D (1 of 2)]
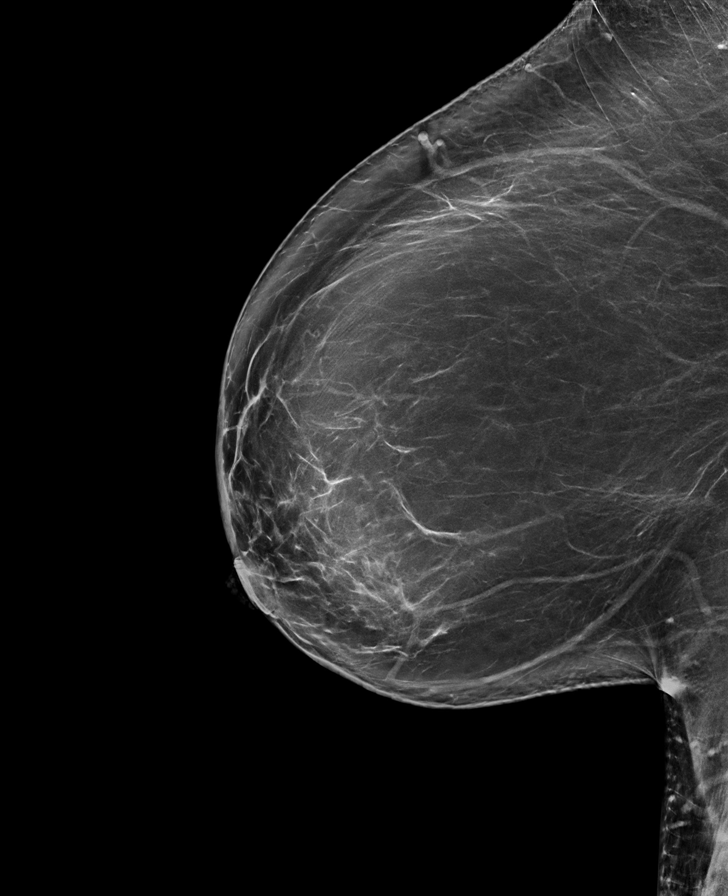

[L CC synth-2D (2 of 2)]
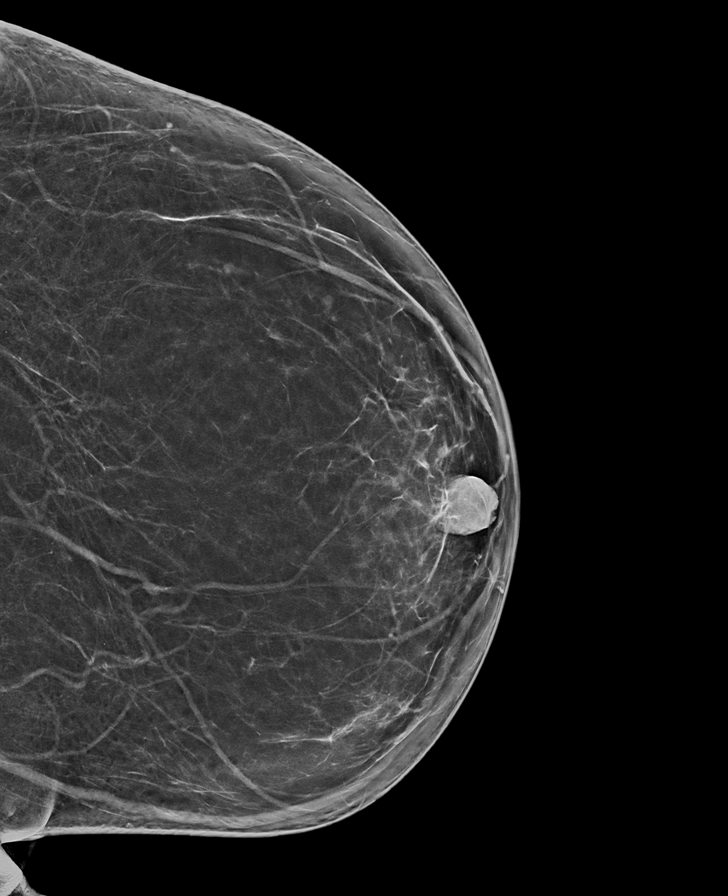

[R CC synth-2D (2 of 2)]
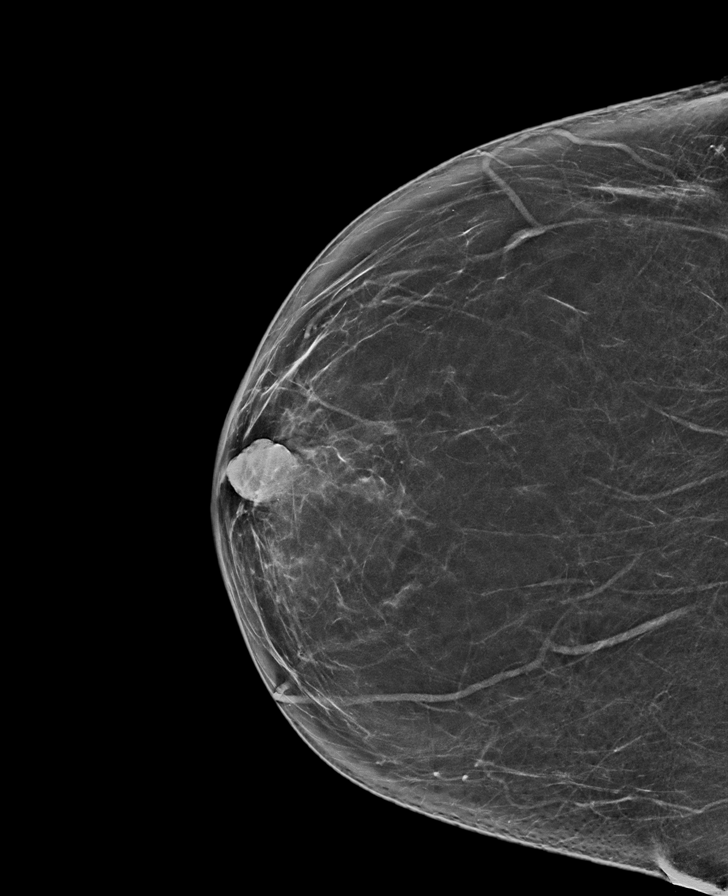

[R MLO synth-2D (2 of 2)]
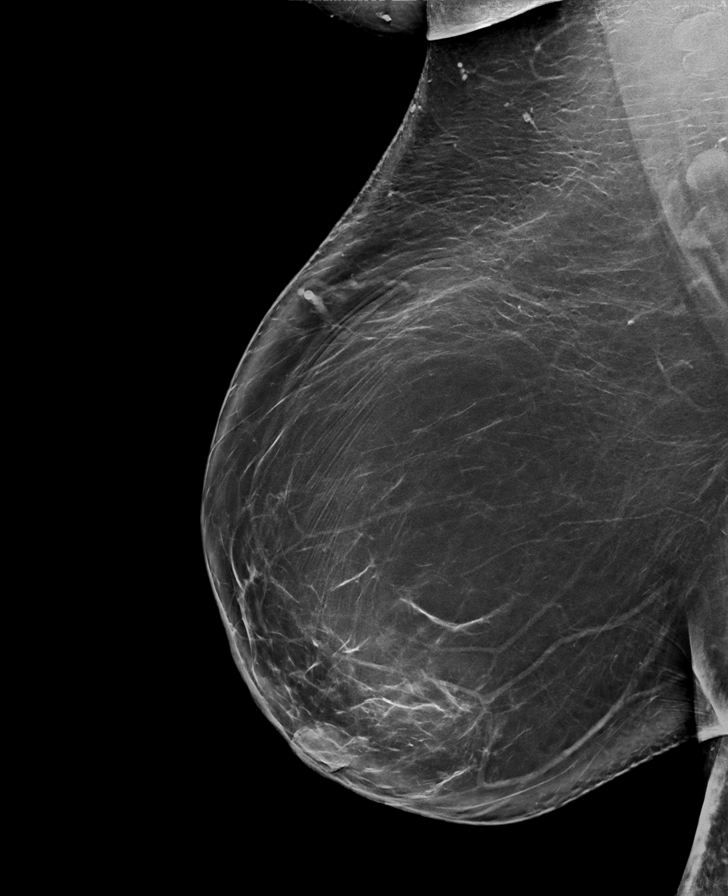

[L MLO synth-2D]
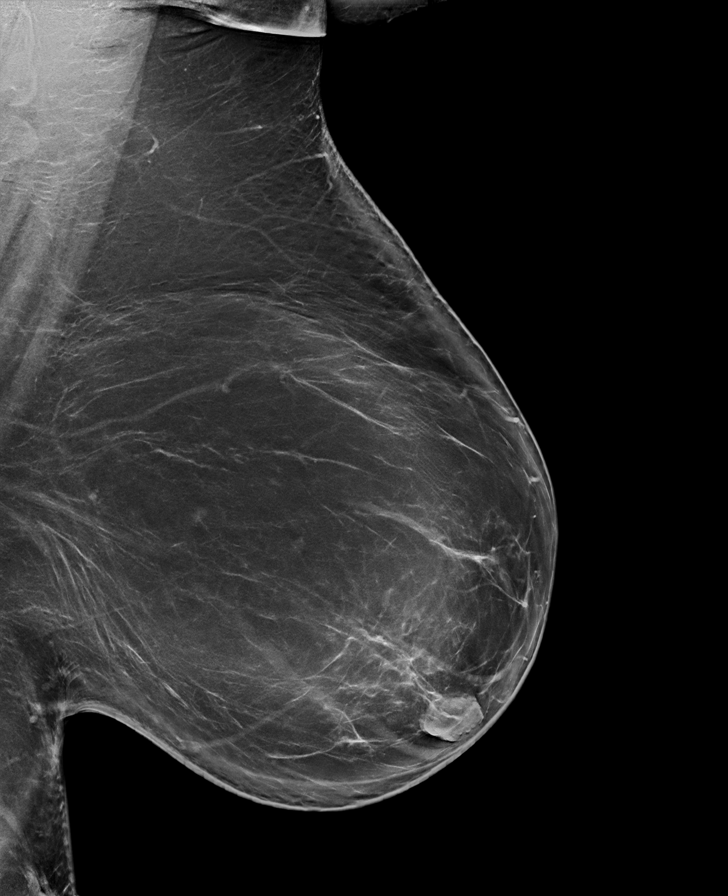

[R MLO tomo · tomo slice 54/107.0]
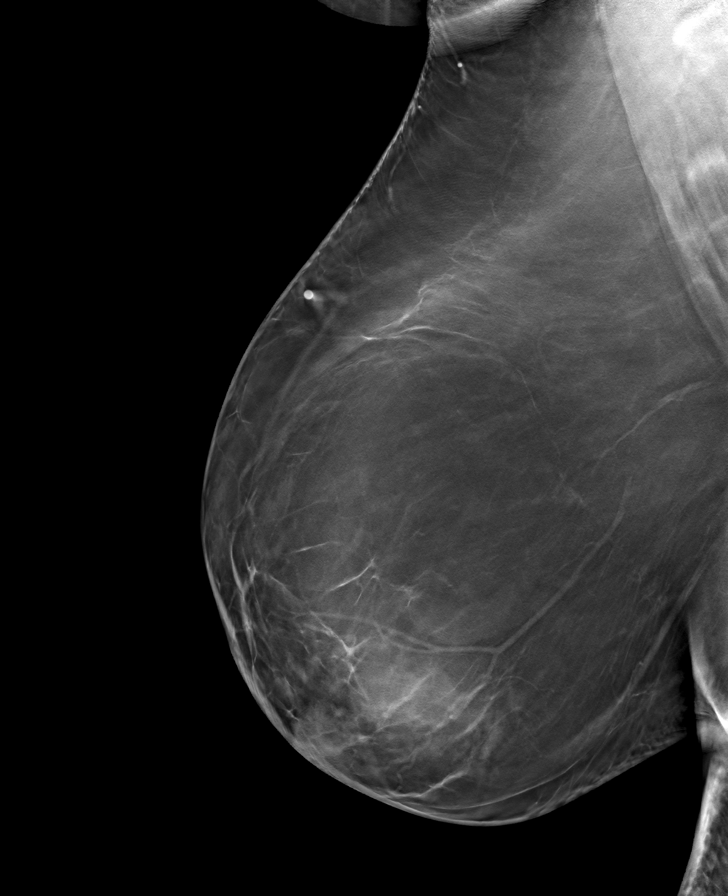

[8 of 40 positions shown; findings below may reference images not displayed]

FINDINGS: There are no findings suspicious for malignancy.
IMPRESSION: No mammographic evidence of malignancy. A result letter of this
screening mammogram will be mailed directly to the patient.

RECOMMENDATION:
Screening mammogram in one year. (Code:0E-3-N98)

BI-RADS CATEGORY  1: Negative.

## 2023-05-03 ENCOUNTER — Other Ambulatory Visit: Payer: Self-pay | Admitting: Internal Medicine

## 2023-05-03 DIAGNOSIS — Z1231 Encounter for screening mammogram for malignant neoplasm of breast: Secondary | ICD-10-CM

## 2023-05-11 ENCOUNTER — Ambulatory Visit
Admission: RE | Admit: 2023-05-11 | Discharge: 2023-05-11 | Disposition: A | Discharge status: Home / Self Care | Payer: Managed Care, Other (non HMO) | Source: Ambulatory Visit | Attending: Internal Medicine | Admitting: Internal Medicine

## 2023-05-11 DIAGNOSIS — Z1231 Encounter for screening mammogram for malignant neoplasm of breast: Secondary | ICD-10-CM | POA: Insufficient documentation

## 2024-07-04 ENCOUNTER — Other Ambulatory Visit: Payer: Self-pay | Admitting: Internal Medicine

## 2024-07-04 DIAGNOSIS — Z1231 Encounter for screening mammogram for malignant neoplasm of breast: Secondary | ICD-10-CM

## 2024-07-11 ENCOUNTER — Other Ambulatory Visit: Payer: Self-pay | Admitting: Internal Medicine

## 2024-07-11 DIAGNOSIS — E7849 Other hyperlipidemia: Secondary | ICD-10-CM

## 2024-07-16 ENCOUNTER — Ambulatory Visit
Admission: RE | Admit: 2024-07-16 | Discharge: 2024-07-16 | Disposition: A | Discharge status: Home / Self Care | Payer: Self-pay | Source: Ambulatory Visit | Attending: Internal Medicine | Admitting: Internal Medicine

## 2024-07-16 DIAGNOSIS — E7849 Other hyperlipidemia: Secondary | ICD-10-CM | POA: Insufficient documentation

## 2024-07-25 ENCOUNTER — Ambulatory Visit
Admission: RE | Admit: 2024-07-25 | Discharge: 2024-07-25 | Disposition: A | Discharge status: Home / Self Care | Source: Ambulatory Visit | Attending: Internal Medicine | Admitting: Internal Medicine

## 2024-07-25 DIAGNOSIS — Z1231 Encounter for screening mammogram for malignant neoplasm of breast: Secondary | ICD-10-CM | POA: Insufficient documentation
